# Patient Record
Sex: Female | Born: 1975 | Race: Black or African American | Hispanic: No | Marital: Single | Smoking: Current every day smoker
Health system: Southern US, Community
[De-identification: ages and names within clinical notes are randomized; demographics above are authoritative.]

## PROBLEM LIST (undated history)

## (undated) DIAGNOSIS — D649 Anemia, unspecified: Secondary | ICD-10-CM

## (undated) HISTORY — DX: Anemia, unspecified: D64.9

---

## 2018-12-23 ENCOUNTER — Emergency Department (HOSPITAL_COMMUNITY)
Admission: EM | Admit: 2018-12-23 | Discharge: 2018-12-23 | Disposition: A | Discharge status: Home / Self Care | Payer: Self-pay | Attending: Emergency Medicine | Admitting: Emergency Medicine

## 2018-12-23 ENCOUNTER — Other Ambulatory Visit: Payer: Self-pay

## 2018-12-23 ENCOUNTER — Emergency Department (HOSPITAL_COMMUNITY): Payer: Self-pay

## 2018-12-23 ENCOUNTER — Encounter (HOSPITAL_COMMUNITY): Payer: Self-pay | Admitting: Emergency Medicine

## 2018-12-23 DIAGNOSIS — F17200 Nicotine dependence, unspecified, uncomplicated: Secondary | ICD-10-CM | POA: Insufficient documentation

## 2018-12-23 DIAGNOSIS — J111 Influenza due to unidentified influenza virus with other respiratory manifestations: Secondary | ICD-10-CM | POA: Insufficient documentation

## 2018-12-23 DIAGNOSIS — R69 Illness, unspecified: Secondary | ICD-10-CM

## 2018-12-23 MED ORDER — OSELTAMIVIR PHOSPHATE 75 MG PO CAPS: 75.0000 mg | ORAL_CAPSULE | Freq: Two times a day (BID) | ORAL | 0 refills | Status: DC

## 2018-12-23 MED ORDER — IBUPROFEN 400 MG PO TABS
400.0000 mg | ORAL_TABLET | Freq: Once | ORAL | Status: AC
  Administered 2018-12-23: 400 mg via ORAL
  Filled 2018-12-23: qty 1

## 2018-12-23 MED ORDER — ACETAMINOPHEN 325 MG PO TABS
650.0000 mg | ORAL_TABLET | Freq: Once | ORAL | Status: AC | PRN
  Administered 2018-12-23: 650 mg via ORAL
  Filled 2018-12-23: qty 2

## 2018-12-23 NOTE — ED Triage Notes (Signed)
C/o fever (Tmax 104.3), generalized body aches, chills, and non-productive cough since Sunday morning.

## 2018-12-23 NOTE — ED Notes (Signed)
ED Provider at bedside for re-evaluation 

## 2018-12-23 NOTE — ED Notes (Signed)
Patient verbalizes understanding of discharge instructions. Opportunity for questioning and answers were provided. Armband removed by staff, pt discharged from ED ambulatory.   

## 2018-12-23 NOTE — ED Provider Notes (Signed)
[facility] Provider Note   CSN: [id number] Arrival date & time: 12/23/18  0531     History   Chief Complaint Chief Complaint  Patient presents with  . flu like symptoms    HPI Leanora Jerrye NobleDenise Dominy is a 43 y.o. female.  Patient is a 43 year old female who presents with a 2-day history of flulike symptoms.  She has had runny nose congestion coughing and myalgias.  She has no vomiting.  No diarrhea.  She does report some intermittent wheezing with shortness of breath.  She denies any current shortness of breath.  She has been using TheraFlu with no improvement in symptoms.     History reviewed. No pertinent past medical history.  There are no active problems to display for this patient.   History reviewed. No pertinent surgical history.   OB History   No obstetric history on file.      Home Medications    Prior to Admission medications   Medication Sig Start Date End Date Taking? Authorizing Provider  oseltamivir (TAMIFLU) 75 MG capsule Take 1 capsule (75 mg total) by mouth every 12 (twelve) hours. 12/23/18   Rolan BuccoBelfi, Evelynn Hench, MD    Family History No family history on file.  Social History Social History   Tobacco Use  . Smoking status: Current Every Day Smoker  . Smokeless tobacco: Never Used  Substance Use Topics  . Alcohol use: Not Currently  . Drug use: Never     Allergies   Patient has no known allergies.   Review of Systems Review of Systems  Constitutional: Positive for fatigue and fever. Negative for chills and diaphoresis.  HENT: Positive for congestion and rhinorrhea. Negative for sneezing.   Eyes: Negative.   Respiratory: Positive for cough, shortness of breath and wheezing. Negative for chest tightness.   Cardiovascular: Negative for chest pain and leg swelling.  Gastrointestinal: Negative for abdominal pain, blood in stool, diarrhea, nausea and vomiting.  Genitourinary: Negative for difficulty  urinating, flank pain, frequency and hematuria.  Musculoskeletal: Positive for myalgias. Negative for arthralgias and back pain.  Skin: Negative for rash.  Neurological: Positive for headaches. Negative for dizziness, speech difficulty, weakness and numbness.     Physical Exam Updated Vital Signs BP 115/65   Pulse (!) 108   Temp (!) 101.4 F (38.6 C) (Oral)   Resp 20   Ht 5\' 5"  (1.651 m)   Wt 83.5 kg   LMP 12/02/2018   SpO2 99%   BMI 30.62 kg/m   Physical Exam Constitutional:      Appearance: She is well-developed.  HENT:     Head: Normocephalic and atraumatic.     Right Ear: Tympanic membrane normal.     Left Ear: Tympanic membrane normal.     Mouth/Throat:     Mouth: Mucous membranes are moist.     Pharynx: No oropharyngeal exudate or posterior oropharyngeal erythema.  Eyes:     Pupils: Pupils are equal, round, and reactive to light.  Neck:     Musculoskeletal: Normal range of motion and neck supple.     Comments: No meningismus Cardiovascular:     Rate and Rhythm: Normal rate and regular rhythm.     Heart sounds: Normal heart sounds.  Pulmonary:     Effort: Pulmonary effort is normal. No respiratory distress.     Breath sounds: Normal breath sounds. No wheezing or rales.  Chest:     Chest wall: No tenderness.  Abdominal:  General: Bowel sounds are normal.     Palpations: Abdomen is soft.     Tenderness: There is no abdominal tenderness. There is no guarding or rebound.  Musculoskeletal: Normal range of motion.  Lymphadenopathy:     Cervical: No cervical adenopathy.  Skin:    General: Skin is warm and dry.     Findings: No rash.  Neurological:     Mental Status: She is alert and oriented to person, place, and time.      ED Treatments / Results  Labs (all labs ordered are listed, but only abnormal results are displayed) Labs Reviewed - No data to display  EKG None  Radiology Dg Chest 2 View  Result Date: 12/23/2018 CLINICAL DATA:  Fever.   Generalized aches and chills. EXAM: CHEST - 2 VIEW COMPARISON:  None. FINDINGS: Probable minimal atelectasis in the left base. The heart, hila, mediastinum, lungs, and pleura are otherwise normal. IMPRESSION: No active cardiopulmonary disease. Electronically Signed   By: Gerome Sam III M.D   On: 12/23/2018 09:55    Procedures Procedures (including critical care time)  Medications Ordered in ED Medications  acetaminophen (TYLENOL) tablet 650 mg (650 mg Oral Given 12/23/18 0815)  ibuprofen (ADVIL,MOTRIN) tablet 400 mg (400 mg Oral Given 12/23/18 1005)     Initial Impression / Assessment and Plan / ED Course  I have reviewed the triage vital signs and the nursing notes.  Pertinent labs & imaging results that were available during my care of the patient were reviewed by me and considered in my medical decision making (see chart for details).     Patient presents with flulike symptoms.  She is feel a bit better after Tylenol.  She has no hypoxia or shortness of breath currently.  Her chest x-ray is negative for pneumonia.  She has no vomiting.  No other symptoms that would be more concerning for meningitis.  She is otherwise well-appearing.  She was discharged home in good condition.  She did opt for Tamiflu treatment.  She was given a prescription for Tamiflu.  Return precautions were given.  Final Clinical Impressions(s) / ED Diagnoses   Final diagnoses:  Influenza-like illness    ED Discharge Orders         Ordered    oseltamivir (TAMIFLU) 75 MG capsule  Every 12 hours     12/23/18 1005           Rolan Bucco, MD 12/23/18 1007

## 2018-12-23 NOTE — ED Notes (Signed)
Patient transported to X-ray 

## 2019-01-30 ENCOUNTER — Ambulatory Visit (HOSPITAL_COMMUNITY)
Admission: EM | Admit: 2019-01-30 | Discharge: 2019-01-30 | Disposition: A | Discharge status: Home / Self Care | Payer: Self-pay | Attending: Family Medicine | Admitting: Family Medicine

## 2019-01-30 ENCOUNTER — Encounter (HOSPITAL_COMMUNITY): Payer: Self-pay | Admitting: Emergency Medicine

## 2019-01-30 ENCOUNTER — Other Ambulatory Visit: Payer: Self-pay

## 2019-01-30 DIAGNOSIS — T148XXA Other injury of unspecified body region, initial encounter: Secondary | ICD-10-CM

## 2019-01-30 DIAGNOSIS — M62830 Muscle spasm of back: Secondary | ICD-10-CM

## 2019-01-30 MED ORDER — CYCLOBENZAPRINE HCL 10 MG PO TABS: 10.0000 mg | ORAL_TABLET | Freq: Two times a day (BID) | ORAL | 0 refills | Status: DC | PRN

## 2019-01-30 MED ORDER — PREDNISONE 10 MG (21) PO TBPK: ORAL_TABLET | ORAL | 0 refills | Status: DC

## 2019-01-30 NOTE — ED Triage Notes (Signed)
Pt complains of mid upper back pain that radiates up her spine into her neck, and right shoulder.

## 2019-01-30 NOTE — ED Provider Notes (Signed)
[facility]    CSN: [id number] Arrival date & time: 01/30/19  9528     History   Chief Complaint Chief Complaint  Patient presents with  . Back Pain    HPI Breanna Carlson is a 43 y.o. female.   Patient is a 43 year old female presents with right upper back pain, neck pain, shoulder pain that has been constant and worsening for the past week.  Describes the pain as sharp and stabbing at times.  Denies any injuries to the shoulder or back.  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  She has been using icy hot on the area without much relief.  She is also been doing massages without much relief.  Denies any fever, chills, swelling, erythema.  Denies any numbness, tingling or weakness of the arm.  ROS per HPI    Back Pain    History reviewed. No pertinent past medical history.  There are no active problems to display for this patient.   History reviewed. No pertinent surgical history.  OB History   No obstetric history on file.      Home Medications    Prior to Admission medications   Medication Sig Start Date End Date Taking? Authorizing Provider  cyclobenzaprine (FLEXERIL) 10 MG tablet Take 1 tablet (10 mg total) by mouth 2 (two) times daily as needed for muscle spasms. 01/30/19   Braedyn Kauk, Gloris Manchester A, NP  predniSONE (STERAPRED UNI-PAK 21 TAB) 10 MG (21) TBPK tablet 6 tabs for 1 day, then 5 tabs for 1 das, then 4 tabs for 1 day, then 3 tabs for 1 day, 2 tabs for 1 day, then 1 tab for 1 day 01/30/19   Janace Aris, NP    Family History History reviewed. No pertinent family history.  Social History Social History   Tobacco Use  . Smoking status: Current Every Day Smoker    Packs/day: 0.50    Types: Cigarettes  . Smokeless tobacco: Never Used  Substance Use Topics  . Alcohol use: Not Currently  . Drug use: Never     Allergies   Patient has no known allergies.   Review of Systems Review of Systems  Musculoskeletal:  Positive for back pain.     Physical Exam Triage Vital Signs ED Triage Vitals  Enc Vitals Group     BP 01/30/19 0847 (!) 144/78     Pulse Rate 01/30/19 0847 75     Resp 01/30/19 0847 18     Temp 01/30/19 0847 98.7 F (37.1 C)     Temp Source 01/30/19 0847 Oral     SpO2 01/30/19 0847 100 %     Weight --      Height --      Head Circumference --      Peak Flow --      Pain Score 01/30/19 0848 9     Pain Loc --      Pain Edu? --      Excl. in GC? --    No data found.  Updated Vital Signs BP (!) 144/78 (BP Location: Left Arm)   Pulse 75   Temp 98.7 F (37.1 C) (Oral)   Resp 18   LMP 01/24/2019 (Approximate)   SpO2 100%   Visual Acuity Right Eye Distance:   Left Eye Distance:   Bilateral Distance:    Right Eye Near:   Left Eye Near:    Bilateral Near:     Physical Exam  Vitals signs and nursing note reviewed.  Constitutional:      Appearance: Normal appearance.     Comments: Appears in pain   HENT:     Head: Normocephalic and atraumatic.     Nose: Nose normal.     Mouth/Throat:     Pharynx: Oropharynx is clear.  Eyes:     Conjunctiva/sclera: Conjunctivae normal.  Neck:     Musculoskeletal: Normal range of motion. No muscular tenderness.  Pulmonary:     Effort: Pulmonary effort is normal.  Musculoskeletal:       Arms:     Comments: Tender to palpation of the thoracic paravertebral musculature into the trapezius muscle.  Muscle spasm present. Patient has full range of motion of the shoulder, neck and back. No bony tenderness.  Skin:    General: Skin is warm and dry.  Neurological:     Mental Status: She is alert.  Psychiatric:        Mood and Affect: Mood normal.      UC Treatments / Results  Labs (all labs ordered are listed, but only abnormal results are displayed) Labs Reviewed - No data to display  EKG None  Radiology No results found.  Procedures Procedures (including critical care time)  Medications Ordered in UC Medications -  No data to display  Initial Impression / Assessment and Plan / UC Course  I have reviewed the triage vital signs and the nursing notes.  Pertinent labs & imaging results that were available during my care of the patient were reviewed by me and considered in my medical decision making (see chart for details).    Symptoms consistent with Muscle spasm with nerve compression We will treat with prednisone daily for the next 6 days and muscle relaxer to use as needed Instructed on exercises with tennis ball and deep massage Follow up as needed for continued or worsening symptoms Work note given   Final Clinical Impressions(s) / UC Diagnoses   Final diagnoses:  Muscle spasm of back  Nerve compression     Discharge Instructions     I believe that you have a muscle spasm causing some nerve compression.  We will treat this with prednisone daily for the next 6 days.  Make sure you take this with food. Muscle relaxer to use as needed.  Be aware this will make you drowsy Try the exercises that we talked about and massaging Follow up as needed for continued or worsening symptoms     ED Prescriptions    Medication Sig Dispense Auth. Provider   predniSONE (STERAPRED UNI-PAK 21 TAB) 10 MG (21) TBPK tablet 6 tabs for 1 day, then 5 tabs for 1 das, then 4 tabs for 1 day, then 3 tabs for 1 day, 2 tabs for 1 day, then 1 tab for 1 day 21 tablet Jaycob Mcclenton A, NP   cyclobenzaprine (FLEXERIL) 10 MG tablet Take 1 tablet (10 mg total) by mouth 2 (two) times daily as needed for muscle spasms. 20 tablet Dahlia Byes A, NP     Controlled Substance Prescriptions [place] Controlled Substance Registry consulted? Not Applicable   Janace Aris, NP 01/30/19 [phone]

## 2019-01-30 NOTE — Discharge Instructions (Signed)
I believe that you have a muscle spasm causing some nerve compression.  We will treat this with prednisone daily for the next 6 days.  Make sure you take this with food. Muscle relaxer to use as needed.  Be aware this will make you drowsy Try the exercises that we talked about and massaging Follow up as needed for continued or worsening symptoms

## 2019-02-10 ENCOUNTER — Emergency Department (HOSPITAL_COMMUNITY)
Admission: EM | Admit: 2019-02-10 | Discharge: 2019-02-10 | Disposition: A | Discharge status: Home / Self Care | Payer: Self-pay | Attending: Emergency Medicine | Admitting: Emergency Medicine

## 2019-02-10 ENCOUNTER — Other Ambulatory Visit: Payer: Self-pay

## 2019-02-10 DIAGNOSIS — X509XXA Other and unspecified overexertion or strenuous movements or postures, initial encounter: Secondary | ICD-10-CM | POA: Insufficient documentation

## 2019-02-10 DIAGNOSIS — F17210 Nicotine dependence, cigarettes, uncomplicated: Secondary | ICD-10-CM | POA: Insufficient documentation

## 2019-02-10 DIAGNOSIS — Y939 Activity, unspecified: Secondary | ICD-10-CM | POA: Insufficient documentation

## 2019-02-10 DIAGNOSIS — Y999 Unspecified external cause status: Secondary | ICD-10-CM | POA: Insufficient documentation

## 2019-02-10 DIAGNOSIS — Y929 Unspecified place or not applicable: Secondary | ICD-10-CM | POA: Insufficient documentation

## 2019-02-10 DIAGNOSIS — S46811A Strain of other muscles, fascia and tendons at shoulder and upper arm level, right arm, initial encounter: Secondary | ICD-10-CM | POA: Insufficient documentation

## 2019-02-10 MED ORDER — ACETAMINOPHEN 500 MG PO TABS
1000.0000 mg | ORAL_TABLET | Freq: Once | ORAL | Status: AC
  Administered 2019-02-10: 1000 mg via ORAL
  Filled 2019-02-10: qty 2

## 2019-02-10 MED ORDER — DIAZEPAM 5 MG PO TABS: 5.0000 mg | ORAL_TABLET | Freq: Every evening | ORAL | 0 refills | Status: DC | PRN

## 2019-02-10 MED ORDER — DIAZEPAM 5 MG PO TABS
5.0000 mg | ORAL_TABLET | Freq: Once | ORAL | Status: AC
  Administered 2019-02-10: 5 mg via ORAL
  Filled 2019-02-10: qty 1

## 2019-02-10 MED ORDER — KETOROLAC TROMETHAMINE 60 MG/2ML IM SOLN
15.0000 mg | Freq: Once | INTRAMUSCULAR | Status: AC
  Administered 2019-02-10: 15 mg via INTRAMUSCULAR
  Filled 2019-02-10: qty 2

## 2019-02-10 MED ORDER — OXYCODONE HCL 5 MG PO TABS
5.0000 mg | ORAL_TABLET | Freq: Once | ORAL | Status: AC
  Administered 2019-02-10: 5 mg via ORAL
  Filled 2019-02-10: qty 1

## 2019-02-10 NOTE — ED Triage Notes (Signed)
Pt having neck back and shoulder pain

## 2019-02-10 NOTE — ED Notes (Signed)
Patient verbalizes understanding of discharge instructions . Opportunity for questions and answers were provided . Armband removed by staff ,Pt discharged from ED. W/C  offered at D/C  and Declined W/C at D/C and was escorted to lobby by RN.  

## 2019-02-10 NOTE — ED Provider Notes (Signed)
[facility] EMERGENCY DEPARTMENT Provider Note   CSN: [id number] Arrival date & time: 02/10/19  1600    History   Chief Complaint Chief Complaint  Patient presents with  . Back Pain  . Shoulder Pain    HPI Akelia Anihya Tuma is a 43 y.o. female.     43 yo F with a chief complaint of right shoulder pain.  This been going on for about 2 weeks now.  Initially started and she continue to work and had ongoing symptoms now she feels that it is gotten into a new area at the base of her skull on the right and up into her neck and down a little bit further in her back.  She also has radiation down to her hand off and on.  She later states that the pain is actually little bit better than it used to be but is concerned because she has ongoing pain.  She had talked with some friends and was concerned for herniated disc.  She denies numbness or weakness of the arm.  The history is provided by the patient.  Back Pain  Associated symptoms: no chest pain, no dysuria, no fever and no headaches   Shoulder Pain  Location:  Shoulder Shoulder location:  R shoulder Injury: no   Pain details:    Quality:  Sharp, shooting, dull and burning   Radiates to:  R arm   Severity:  Severe   Onset quality:  Gradual   Duration:  2 weeks   Timing:  Constant   Progression:  Worsening Handedness:  Right-handed Dislocation: no   Prior injury to area:  No Relieved by:  Nothing Worsened by:  Nothing Ineffective treatments:  None tried Associated symptoms: neck pain   Associated symptoms: no back pain and no fever     No past medical history on file.  There are no active problems to display for this patient.   No past surgical history on file.   OB History   No obstetric history on file.      Home Medications    Prior to Admission medications   Medication Sig Start Date End Date Taking? Authorizing Provider  diazepam (VALIUM) 5 MG tablet Take 1 tablet (5 mg total) by  mouth at bedtime as needed for muscle spasms. 02/10/19   Melene Plan, DO  predniSONE (STERAPRED UNI-PAK 21 TAB) 10 MG (21) TBPK tablet 6 tabs for 1 day, then 5 tabs for 1 das, then 4 tabs for 1 day, then 3 tabs for 1 day, 2 tabs for 1 day, then 1 tab for 1 day 01/30/19   Janace Aris, NP    Family History No family history on file.  Social History Social History   Tobacco Use  . Smoking status: Current Every Day Smoker    Packs/day: 0.50    Types: Cigarettes  . Smokeless tobacco: Never Used  Substance Use Topics  . Alcohol use: Not Currently  . Drug use: Never     Allergies   Patient has no known allergies.   Review of Systems Review of Systems  Constitutional: Negative for chills and fever.  HENT: Negative for congestion and rhinorrhea.   Eyes: Negative for redness and visual disturbance.  Respiratory: Negative for shortness of breath and wheezing.   Cardiovascular: Negative for chest pain and palpitations.  Gastrointestinal: Negative for nausea and vomiting.  Genitourinary: Negative for dysuria and urgency.  Musculoskeletal: Positive for arthralgias and neck pain. Negative for back pain  and myalgias.  Skin: Negative for pallor and wound.  Neurological: Negative for dizziness and headaches.     Physical Exam Updated Vital Signs BP 111/83 (BP Location: Right Arm)   Pulse 100   Temp 98.5 F (36.9 C) (Oral)   Resp 16   Ht 5\' 5"  (1.651 m)   Wt 81 kg   LMP 01/24/2019 (Approximate)   SpO2 100%   BMI 29.72 kg/m   Physical Exam Vitals signs and nursing note reviewed.  Constitutional:      General: She is not in acute distress.    Appearance: She is well-developed. She is not diaphoretic.  HENT:     Head: Normocephalic and atraumatic.  Eyes:     Pupils: Pupils are equal, round, and reactive to light.  Neck:     Musculoskeletal: Normal range of motion and neck supple.  Cardiovascular:     Rate and Rhythm: Normal rate and regular rhythm.     Heart sounds: No  murmur. No friction rub. No gallop.   Pulmonary:     Effort: Pulmonary effort is normal.     Breath sounds: No wheezing or rales.  Abdominal:     General: There is no distension.     Palpations: Abdomen is soft.     Tenderness: There is no abdominal tenderness.  Musculoskeletal:        General: Tenderness present.     Comments: Tenderness diffusely about the trapezius with some muscle spasm.  This extends up to the attachment of the occiput and down to the acromion.  She also has some pain between the scapula and the spine.  There is all on the right side.  Pulse motor and sensation is intact to the right upper extremity. She has no pain with range of motion of the neck, difficult to perform Spurling's due to her pain.  Skin:    General: Skin is warm and dry.  Neurological:     Mental Status: She is alert and oriented to person, place, and time.  Psychiatric:        Behavior: Behavior normal.      ED Treatments / Results  Labs (all labs ordered are listed, but only abnormal results are displayed) Labs Reviewed - No data to display  EKG None  Radiology No results found.  Procedures Procedures (including critical care time)  Medications Ordered in ED Medications  acetaminophen (TYLENOL) tablet 1,000 mg (1,000 mg Oral Given 02/10/19 1648)  ketorolac (TORADOL) injection 15 mg (15 mg Intramuscular Given 02/10/19 1648)  oxyCODONE (Oxy IR/ROXICODONE) immediate release tablet 5 mg (5 mg Oral Given 02/10/19 1648)  diazepam (VALIUM) tablet 5 mg (5 mg Oral Given 02/10/19 1648)     Initial Impression / Assessment and Plan / ED Course  I have reviewed the triage vital signs and the nursing notes.  Pertinent labs & imaging results that were available during my care of the patient were reviewed by me and considered in my medical decision making (see chart for details).        43 yo F with a chief complaint of right-sided neck pain.  Going on for about a week and a half now.   Reproduced with palpation of the trapezius muscle belly.  I suspect that this is muscular strain and worsened by her not resting.  She also has not had any NSAIDs for this.  We will start her on NSAIDs, Valium for muscle relaxant.  Placed in a sling. PCP follow-up.  5:06 PM:  I have discussed the diagnosis/risks/treatment options with the patient and believe the pt to be eligible for discharge home to follow-up with PCP. We also discussed returning to the ED immediately if new or worsening sx occur. We discussed the sx which are most concerning (e.g., sudden worsening pain, fever, numbness or weakness to the arm) that necessitate immediate return. Medications administered to the patient during their visit and any new prescriptions provided to the patient are listed below.  Medications given during this visit Medications  acetaminophen (TYLENOL) tablet 1,000 mg (1,000 mg Oral Given 02/10/19 1648)  ketorolac (TORADOL) injection 15 mg (15 mg Intramuscular Given 02/10/19 1648)  oxyCODONE (Oxy IR/ROXICODONE) immediate release tablet 5 mg (5 mg Oral Given 02/10/19 1648)  diazepam (VALIUM) tablet 5 mg (5 mg Oral Given 02/10/19 1648)     The patient appears reasonably screen and/or stabilized for discharge and I doubt any other medical condition or other [facility] requiring further screening, evaluation, or treatment in the ED at this time prior to discharge.    Final Clinical Impressions(s) / ED Diagnoses   Final diagnoses:  Trapezius strain, right, initial encounter    ED Discharge Orders         Ordered    diazepam (VALIUM) 5 MG tablet  At bedtime PRN     02/10/19 1636           Melene Plan, DO 02/10/19 1707

## 2019-02-10 NOTE — Discharge Instructions (Addendum)
Take 4 over the counter ibuprofen tablets 3 times a day or 2 over-the-counter naproxen tablets twice a day for pain. Also take tylenol 1000mg (2 extra strength) four times a day.   Take your arm out of the sling at least 4 times a day and perform range of motion exercises.  Please return to the emergency department for numbness or weakness to the arm.  Please do not take the Flexeril with Valium.  This can make you excessively sleepy and could make you have an accident or injury.

## 2019-12-01 ENCOUNTER — Other Ambulatory Visit: Payer: Self-pay

## 2019-12-01 ENCOUNTER — Ambulatory Visit (HOSPITAL_COMMUNITY)
Admission: EM | Admit: 2019-12-01 | Discharge: 2019-12-01 | Disposition: A | Discharge status: Home / Self Care | Payer: HRSA Program | Attending: Family Medicine | Admitting: Family Medicine

## 2019-12-01 ENCOUNTER — Encounter (HOSPITAL_COMMUNITY): Payer: Self-pay | Admitting: Emergency Medicine

## 2019-12-01 DIAGNOSIS — J069 Acute upper respiratory infection, unspecified: Secondary | ICD-10-CM | POA: Diagnosis present

## 2019-12-01 DIAGNOSIS — Z20822 Contact with and (suspected) exposure to covid-19: Secondary | ICD-10-CM | POA: Insufficient documentation

## 2019-12-01 NOTE — Discharge Instructions (Signed)
CALL FOR PROBLEMS Go home to rest Drink plenty of fluids Take Tylenol for pain or fever You may take over-the-counter cough and cold medicines as needed You must quarantine at home until your test result is available You can check for your test result in MyChart

## 2019-12-01 NOTE — ED Triage Notes (Addendum)
Pt states that her family that she does not live with but was last exposed to last Thursday have all tested positive for Covid. (upon further questioning her family tested positive weeks ago and she only saw them at the door on Thursday.)  Pt reports 2 week history of mild chest pain with SOB, a 2 day history of headache and and 3 days of diarrhea.   Pt denies fever, but states she gets chills.  Pt states her food does not taste right.

## 2019-12-01 NOTE — ED Provider Notes (Signed)
[place]    CSN: [id number] Arrival date & time: 12/01/19  1255      History   Chief Complaint Chief Complaint  Patient presents with  . Covid Exposure    HPI Breanna Carlson is a 44 y.o. female.   HPI  Family has COVID Briefly exposed Diarrhea and chest tightness for several days.  Shortness of breath with exertion.  No coughing.  No chest pain. She does feel some fatigue and body aches. No loss of appetite, sense of taste or smell She requests Covid testing She is been trying to drink enough fluids.  Questions whether she can take something for diarrhea.  Questions management of her viral symptoms  History reviewed. No pertinent past medical history.  There are no problems to display for this patient.   History reviewed. No pertinent surgical history.  OB History   No obstetric history on file.      Home Medications    Prior to Admission medications   Medication Sig Start Date End Date Taking? Authorizing Provider  diazepam (VALIUM) 5 MG tablet Take 1 tablet (5 mg total) by mouth at bedtime as needed for muscle spasms. 02/10/19   Deno Etienne, DO    Family History Family History  Problem Relation Age of Onset  . Diabetes Mother   . Hypertension Mother   . Seizures Mother     Social History Social History   Tobacco Use  . Smoking status: Current Every Day Smoker    Packs/day: 0.50    Types: Cigarettes  . Smokeless tobacco: Never Used  Substance Use Topics  . Alcohol use: Not Currently  . Drug use: Never     Allergies   Patient has no known allergies.   Review of Systems Review of Systems  Constitutional: Positive for fatigue. Negative for chills and fever.  HENT: Negative for congestion and hearing loss.   Eyes: Negative for pain.  Respiratory: Positive for chest tightness and shortness of breath. Negative for cough.   Cardiovascular: Negative for chest pain and leg swelling.  Gastrointestinal: Positive for diarrhea.  Negative for nausea.  Genitourinary: Negative for dysuria and frequency.  Musculoskeletal: Negative for myalgias.  Neurological: Negative for dizziness and headaches.  Psychiatric/Behavioral: The patient is not nervous/anxious.      Physical Exam Triage Vital Signs ED Triage Vitals [12/01/19 1429]  Enc Vitals Group     BP (!) 141/103     Pulse Rate 76     Resp 14     Temp 99 F (37.2 C)     Temp Source Oral     SpO2 100 %     Weight      Height      Head Circumference      Peak Flow      Pain Score      Pain Loc      Pain Edu?      Excl. in [place]?    No data found.  Updated Vital Signs BP (!) 141/103 (BP Location: Left Arm)   Pulse 76   Temp 99 F (37.2 C) (Oral)   Resp 14   LMP 11/27/2019 (Exact Date)   SpO2 100%   Physical Exam Constitutional:      General: She is not in acute distress.    Appearance: She is well-developed. She is ill-appearing.     Comments: Appears tired  HENT:     Head: Normocephalic and atraumatic.     Mouth/Throat:  Comments: Mask is in place Eyes:     Conjunctiva/sclera: Conjunctivae normal.     Pupils: Pupils are equal, round, and reactive to light.  Cardiovascular:     Rate and Rhythm: Normal rate.     Heart sounds: Normal heart sounds.  Pulmonary:     Effort: Pulmonary effort is normal. No respiratory distress.     Breath sounds: No wheezing or rales.  Abdominal:     General: There is no distension.     Palpations: Abdomen is soft.     Tenderness: There is no abdominal tenderness.  Musculoskeletal:        General: Normal range of motion.     Cervical back: Normal range of motion.  Skin:    General: Skin is warm and dry.  Neurological:     Mental Status: She is alert.  Psychiatric:        Mood and Affect: Mood normal.        Behavior: Behavior normal.      UC Treatments / Results  Labs (all labs ordered are listed, but only abnormal results are displayed) Labs Reviewed  NOVEL CORONAVIRUS, NAA (HOSP ORDER,  SEND-OUT TO REF LAB; TAT 18-24 HRS)    EKG   Radiology No results found.  Procedures Procedures (including critical care time)  Medications Ordered in UC Medications - No data to display  Initial Impression / Assessment and Plan / UC Course  I have reviewed the triage vital signs and the nursing notes.  Pertinent labs & imaging results that were available during my care of the patient were reviewed by me and considered in my medical decision making (see chart for details).     We reviewed signs and symptoms of Covid.  Treatment at home.  Reasons for return Final Clinical Impressions(s) / UC Diagnoses   Final diagnoses:  Viral URI  Exposure to COVID-19 virus     Discharge Instructions     CALL FOR PROBLEMS Go home to rest Drink plenty of fluids Take Tylenol for pain or fever You may take over-the-counter cough and cold medicines as needed You must quarantine at home until your test result is available You can check for your test result in MyChart    ED Prescriptions    None     PDMP not reviewed this encounter.   Eustace Moore, MD 12/01/19 [phone]

## 2019-12-03 LAB — NOVEL CORONAVIRUS, NAA (HOSP ORDER, SEND-OUT TO REF LAB; TAT 18-24 HRS): SARS-CoV-2, NAA: NOT DETECTED

## 2020-02-18 ENCOUNTER — Other Ambulatory Visit: Payer: Self-pay

## 2020-02-18 ENCOUNTER — Encounter (HOSPITAL_COMMUNITY): Payer: Self-pay

## 2020-02-18 ENCOUNTER — Ambulatory Visit (HOSPITAL_COMMUNITY)
Admission: EM | Admit: 2020-02-18 | Discharge: 2020-02-18 | Disposition: A | Discharge status: Home / Self Care | Payer: Self-pay | Attending: Family Medicine | Admitting: Family Medicine

## 2020-02-18 DIAGNOSIS — R5383 Other fatigue: Secondary | ICD-10-CM

## 2020-02-18 DIAGNOSIS — J3489 Other specified disorders of nose and nasal sinuses: Secondary | ICD-10-CM

## 2020-02-18 DIAGNOSIS — G43909 Migraine, unspecified, not intractable, without status migrainosus: Secondary | ICD-10-CM

## 2020-02-18 MED ORDER — METOCLOPRAMIDE HCL 5 MG/ML IJ SOLN
5.0000 mg | Freq: Once | INTRAMUSCULAR | Status: AC
  Administered 2020-02-18: 15:00:00 5 mg via INTRAMUSCULAR

## 2020-02-18 MED ORDER — NAPROXEN 500 MG PO TABS: 500.0000 mg | ORAL_TABLET | Freq: Two times a day (BID) | ORAL | 0 refills | Status: DC | PRN

## 2020-02-18 MED ORDER — METOCLOPRAMIDE HCL 5 MG/ML IJ SOLN
INTRAMUSCULAR | Status: AC
  Filled 2020-02-18: qty 2

## 2020-02-18 MED ORDER — DEXAMETHASONE SODIUM PHOSPHATE 10 MG/ML IJ SOLN
INTRAMUSCULAR | Status: AC
  Filled 2020-02-18: qty 1

## 2020-02-18 MED ORDER — KETOROLAC TROMETHAMINE 30 MG/ML IJ SOLN
INTRAMUSCULAR | Status: AC
  Filled 2020-02-18: qty 1

## 2020-02-18 MED ORDER — AMOXICILLIN-POT CLAVULANATE 875-125 MG PO TABS: 1.0000 | ORAL_TABLET | Freq: Two times a day (BID) | ORAL | 0 refills | Status: AC

## 2020-02-18 MED ORDER — DEXAMETHASONE SODIUM PHOSPHATE 10 MG/ML IJ SOLN
10.0000 mg | Freq: Once | INTRAMUSCULAR | Status: AC
  Administered 2020-02-18: 15:00:00 10 mg via INTRAMUSCULAR

## 2020-02-18 MED ORDER — KETOROLAC TROMETHAMINE 30 MG/ML IJ SOLN
30.0000 mg | Freq: Once | INTRAMUSCULAR | Status: AC
  Administered 2020-02-18: 15:00:00 30 mg via INTRAMUSCULAR

## 2020-02-18 NOTE — Discharge Instructions (Signed)
Covid swab pending, monitor my chart for results We gave you an injection of Toradol, Decadron and Reglan today for your headache.  This should begin working in 30 to 40 minutes Continue with Naprosyn twice daily as needed for further headaches Augmentin twice daily for the next week to treat any dental infection/sinusitis Rest and push fluids  Please continue to monitor symptoms, follow-up if not improving or worsening

## 2020-02-18 NOTE — ED Notes (Signed)
Pt refused COVID test 

## 2020-02-18 NOTE — ED Triage Notes (Signed)
Pt c/o HA, loss of appetite, dehydration, weakness, chills since yesterday. Pt has sunglasses on, because she has light sensitivity due to the HA.

## 2020-02-19 NOTE — ED Provider Notes (Signed)
[place]    CSN: [id number] Arrival date & time: 02/18/20  1259      History   Chief Complaint Chief Complaint  Patient presents with  . HA, loss of appetite, dehydration, weakness    HPI Breanna Carlson is a 44 y.o. female a significant past medical history presenting today for evaluation of headache, fatigue, chills, headache and dental pain.  Patient notes that over the past week she has felt under the weather, over the past few days her symptoms have worsened.  She reports a persistent headache and bilateral temporal areas with associated photophobia and phonophobia.  Denies significant history of headaches or migraines.  She denies vision changes.  She is also had a lot of sinus pressure.  Has felt very fatigued and decreased appetite in the past 24 hours.  She also notes pain and swelling to her left lower jaw that is also worsened recently.  She denies any neck stiffness.  Denies vomiting, diarrhea.  Denies close sick contacts.  HPI  History reviewed. No pertinent past medical history.  There are no problems to display for this patient.   History reviewed. No pertinent surgical history.  OB History   No obstetric history on file.      Home Medications    Prior to Admission medications   Medication Sig Start Date End Date Taking? Authorizing Provider  amoxicillin-clavulanate (AUGMENTIN) 875-125 MG tablet Take 1 tablet by mouth every 12 (twelve) hours for 7 days. 02/18/20 02/25/20  Jin Capote C, PA-C  naproxen (NAPROSYN) 500 MG tablet Take 1 tablet (500 mg total) by mouth 2 (two) times daily as needed for headache. 02/18/20   Thyra Yinger, Elesa Hacker, PA-C    Family History Family History  Problem Relation Age of Onset  . Diabetes Mother   . Hypertension Mother   . Seizures Mother     Social History Social History   Tobacco Use  . Smoking status: Current Every Day Smoker    Packs/day: 0.50    Types: Cigarettes  . Smokeless tobacco: Never Used    Substance Use Topics  . Alcohol use: Not Currently  . Drug use: Never     Allergies   Patient has no known allergies.   Review of Systems Review of Systems  Constitutional: Positive for appetite change, chills and fatigue. Negative for activity change and fever.  HENT: Positive for congestion and sinus pressure. Negative for ear pain, rhinorrhea, sore throat and trouble swallowing.   Eyes: Positive for photophobia. Negative for discharge and redness.  Respiratory: Negative for cough, chest tightness and shortness of breath.   Cardiovascular: Negative for chest pain.  Gastrointestinal: Positive for nausea. Negative for abdominal pain, diarrhea and vomiting.  Musculoskeletal: Negative for myalgias.  Skin: Negative for rash.  Neurological: Positive for headaches. Negative for dizziness and light-headedness.     Physical Exam Triage Vital Signs ED Triage Vitals  Enc Vitals Group     BP 02/18/20 1343 127/77     Pulse Rate 02/18/20 1343 82     Resp 02/18/20 1343 16     Temp 02/18/20 1343 98.1 F (36.7 C)     Temp Source 02/18/20 1343 Oral     SpO2 02/18/20 1343 100 %     Weight 02/18/20 1342 183 lb (83 kg)     Height 02/18/20 1342 5\' 6"  (1.676 m)     Head Circumference --      Peak Flow --      Pain Score 02/18/20  1341 9     Pain Loc --      Pain Edu? --      Excl. in [place]? --    No data found.  Updated Vital Signs BP 127/77   Pulse 82   Temp 98.1 F (36.7 C) (Oral)   Resp 16   Ht 5\' 6"  (1.676 m)   Wt 183 lb (83 kg)   SpO2 100%   BMI 29.54 kg/m   Visual Acuity Right Eye Distance:   Left Eye Distance:   Bilateral Distance:    Right Eye Near:   Left Eye Near:    Bilateral Near:     Physical Exam Vitals and nursing note reviewed.  Constitutional:      Appearance: She is well-developed.     Comments: No acute distress; wearing sunglasses  HENT:     Head: Normocephalic and atraumatic.     Ears:     Comments: Bilateral ears without tenderness to  palpation of external auricle, tragus and mastoid, EAC's without erythema or swelling, TM's with good bony landmarks and cone of light. Non erythematous.     Nose: Nose normal.     Mouth/Throat:     Comments: Oral mucosa pink and moist, no tonsillar enlargement or exudate. Posterior pharynx patent and nonerythematous, no uvula deviation or swelling. Normal phonation.  Gingival tenderness and mild swelling noted to right lower posterior jaw Eyes:     Extraocular Movements: Extraocular movements intact.     Conjunctiva/sclera: Conjunctivae normal.     Pupils: Pupils are equal, round, and reactive to light.  Neck:     Comments: Full active range of motion of neck, no nuchal rigidity Cardiovascular:     Rate and Rhythm: Normal rate.  Pulmonary:     Effort: Pulmonary effort is normal. No respiratory distress.     Comments: Breathing comfortably at rest, CTABL, no wheezing, rales or other adventitious sounds auscultated Abdominal:     General: There is no distension.  Musculoskeletal:        General: Normal range of motion.     Cervical back: Neck supple.  Skin:    General: Skin is warm and dry.  Neurological:     General: No focal deficit present.     Mental Status: She is alert and oriented to person, place, and time. Mental status is at baseline.     Cranial Nerves: No cranial nerve deficit.     Motor: No weakness.     Gait: Gait normal.      UC Treatments / Results  Labs (all labs ordered are listed, but only abnormal results are displayed) Labs Reviewed  SARS CORONAVIRUS 2 (TAT 6-24 HRS)    EKG   Radiology No results found.  Procedures Procedures (including critical care time)  Medications Ordered in UC Medications  ketorolac (TORADOL) 30 MG/ML injection 30 mg (30 mg Intramuscular Given 02/18/20 1440)  metoCLOPramide (REGLAN) injection 5 mg (5 mg Intramuscular Given 02/18/20 1440)  dexamethasone (DECADRON) injection 10 mg (10 mg Intramuscular Given 02/18/20 1440)     Initial Impression / Assessment and Plan / UC Course  I have reviewed the triage vital signs and the nursing notes.  Pertinent labs & imaging results that were available during my care of the patient were reviewed by me and considered in my medical decision making (see chart for details).     Covid PCR pending.  Headache suggestive of likely migraine given associated photophobia, providing migraine cocktail to help with headache.  Discussed symptoms may also be stemming from underlying sinusitis or dental infection.  Treating with Augmentin given mild symptoms over the past week with recent worsening.  Naprosyn for further headache.  Rest and push fluids.  No neuro deficits.  Close monitoring of all symptoms,Discussed strict return precautions. Patient verbalized understanding and is agreeable with plan.  Final Clinical Impressions(s) / UC Diagnoses   Final diagnoses:  Migraine without status migrainosus, not intractable, unspecified migraine type  Sinus pressure  Fatigue, unspecified type     Discharge Instructions     Covid swab pending, monitor my chart for results We gave you an injection of Toradol, Decadron and Reglan today for your headache.  This should begin working in 30 to 40 minutes Continue with Naprosyn twice daily as needed for further headaches Augmentin twice daily for the next week to treat any dental infection/sinusitis Rest and push fluids  Please continue to monitor symptoms, follow-up if not improving or worsening   ED Prescriptions    Medication Sig Dispense Auth. Provider   amoxicillin-clavulanate (AUGMENTIN) 875-125 MG tablet Take 1 tablet by mouth every 12 (twelve) hours for 7 days. 14 tablet Josy Peaden C, PA-C   naproxen (NAPROSYN) 500 MG tablet Take 1 tablet (500 mg total) by mouth 2 (two) times daily as needed for headache. 30 tablet Suki Crockett, [place] C, PA-C     PDMP not reviewed this encounter.   Lew Dawes, [place] 02/19/20 [phone]

## 2020-02-27 ENCOUNTER — Ambulatory Visit (HOSPITAL_COMMUNITY)
Admission: EM | Admit: 2020-02-27 | Discharge: 2020-02-27 | Disposition: A | Discharge status: Home / Self Care | Payer: HRSA Program | Attending: Internal Medicine | Admitting: Internal Medicine

## 2020-02-27 ENCOUNTER — Other Ambulatory Visit: Payer: Self-pay

## 2020-02-27 DIAGNOSIS — Z20822 Contact with and (suspected) exposure to covid-19: Secondary | ICD-10-CM | POA: Insufficient documentation

## 2020-02-27 NOTE — ED Notes (Signed)
Patient returned for COVID swab, needs negative test to return to work.

## 2020-02-28 LAB — SARS CORONAVIRUS 2 (TAT 6-24 HRS): SARS Coronavirus 2: NEGATIVE

## 2020-05-19 IMAGING — DX DG CHEST 2V
2 series · 2 of 2 positions shown · non-contrast
Comparison: None.

CLINICAL DATA: Fever.  Generalized aches and chills.

EXAM:
CHEST - 2 VIEW

[w chest pa]
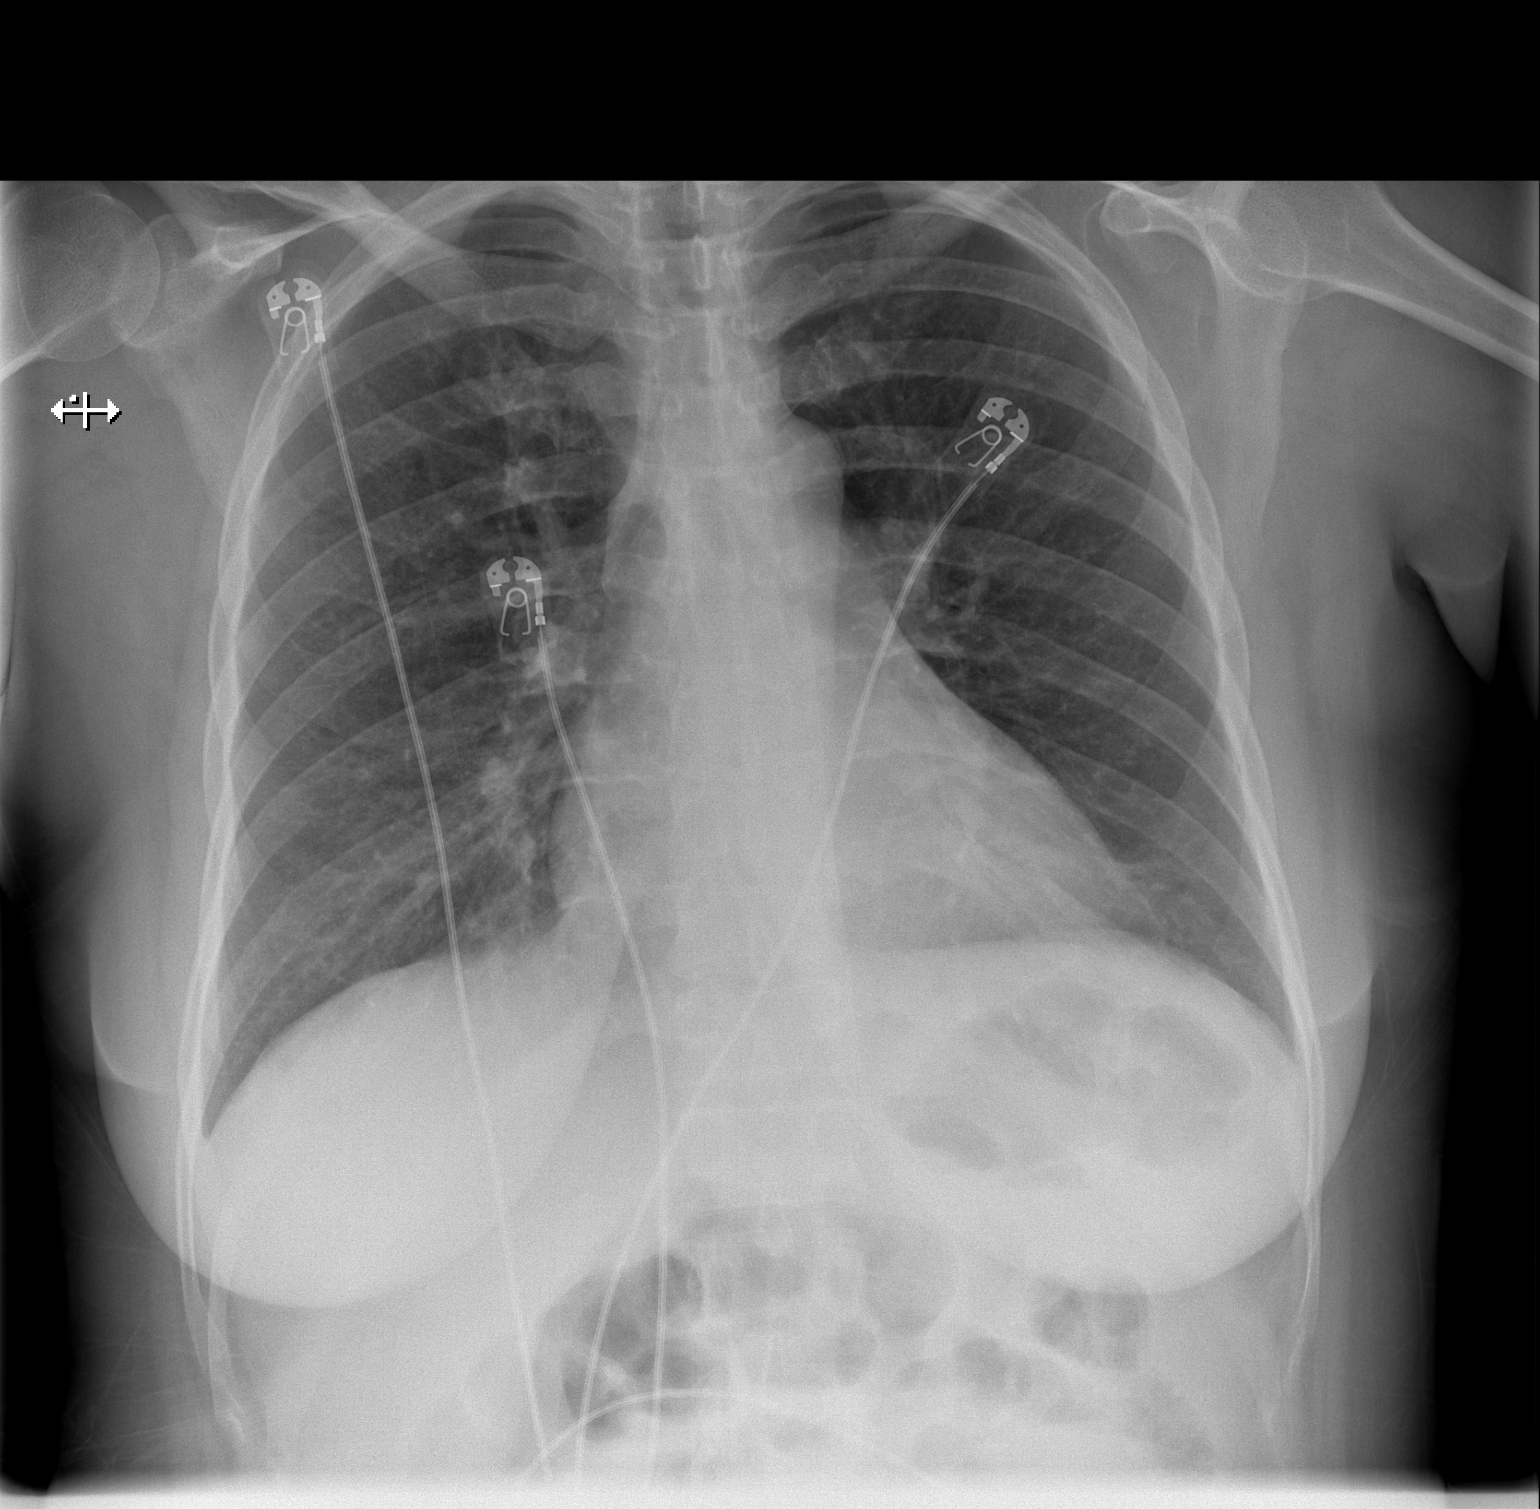

[w chest lat]
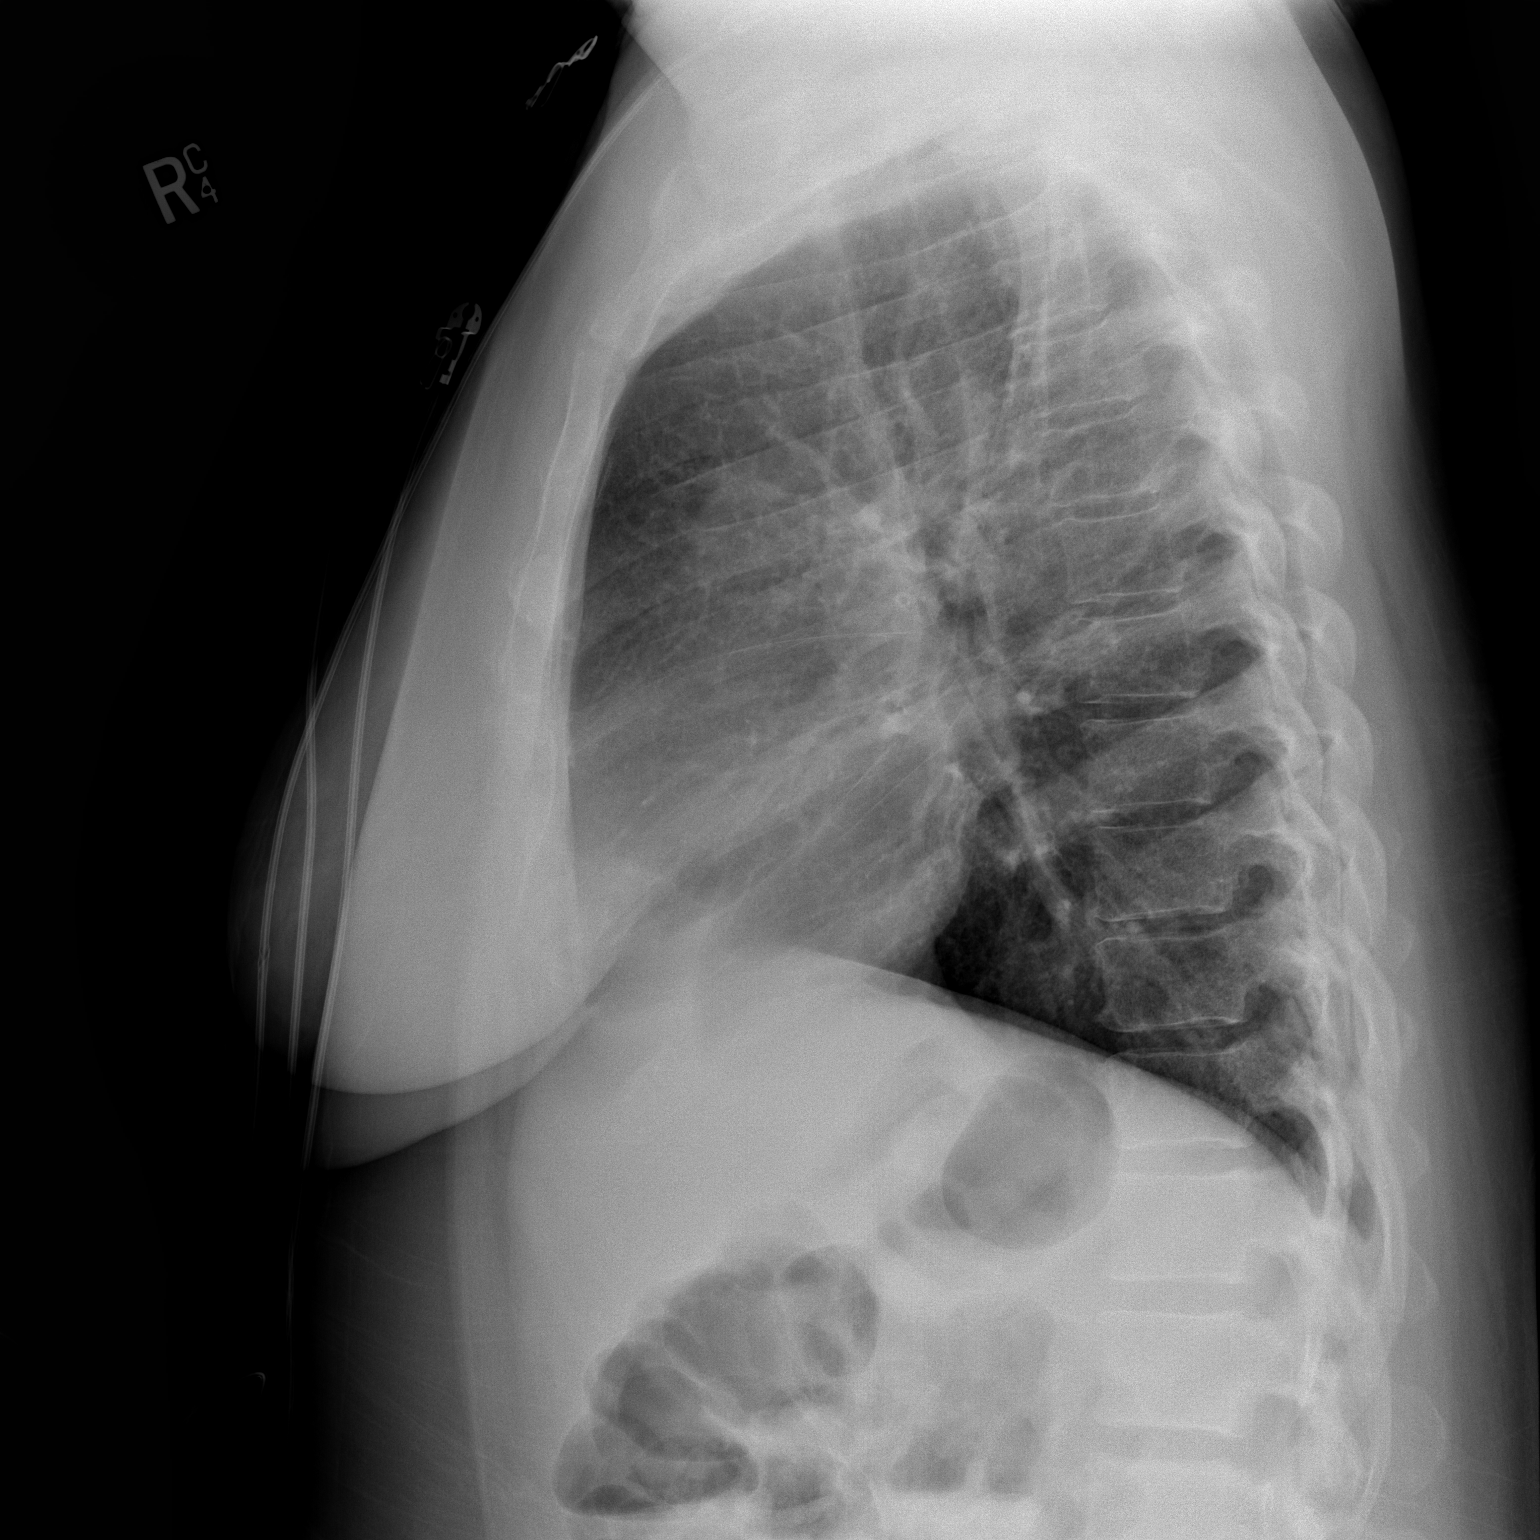

[2 of 2 positions shown; findings below may reference images not displayed]

FINDINGS: Probable minimal atelectasis in the left base. The heart, hila,
mediastinum, lungs, and pleura are otherwise normal.
IMPRESSION: No active cardiopulmonary disease.

## 2023-09-02 ENCOUNTER — Encounter (HOSPITAL_COMMUNITY): Payer: Self-pay

## 2023-09-02 ENCOUNTER — Emergency Department (HOSPITAL_COMMUNITY): Payer: Self-pay

## 2023-09-02 ENCOUNTER — Emergency Department (HOSPITAL_COMMUNITY): Payer: 59

## 2023-09-02 ENCOUNTER — Emergency Department (HOSPITAL_COMMUNITY)
Admission: EM | Admit: 2023-09-02 | Discharge: 2023-09-02 | Disposition: A | Discharge status: Home / Self Care | Payer: 59 | Source: Home / Self Care | Attending: Emergency Medicine | Admitting: Emergency Medicine

## 2023-09-02 ENCOUNTER — Other Ambulatory Visit: Payer: Self-pay

## 2023-09-02 DIAGNOSIS — R0789 Other chest pain: Secondary | ICD-10-CM | POA: Insufficient documentation

## 2023-09-02 DIAGNOSIS — R519 Headache, unspecified: Secondary | ICD-10-CM | POA: Insufficient documentation

## 2023-09-02 DIAGNOSIS — D649 Anemia, unspecified: Secondary | ICD-10-CM | POA: Diagnosis not present

## 2023-09-02 DIAGNOSIS — Z20822 Contact with and (suspected) exposure to covid-19: Secondary | ICD-10-CM | POA: Insufficient documentation

## 2023-09-02 DIAGNOSIS — F17210 Nicotine dependence, cigarettes, uncomplicated: Secondary | ICD-10-CM | POA: Diagnosis not present

## 2023-09-02 DIAGNOSIS — R109 Unspecified abdominal pain: Secondary | ICD-10-CM | POA: Diagnosis present

## 2023-09-02 DIAGNOSIS — N10 Acute tubulo-interstitial nephritis: Secondary | ICD-10-CM | POA: Diagnosis not present

## 2023-09-02 DIAGNOSIS — N390 Urinary tract infection, site not specified: Secondary | ICD-10-CM | POA: Insufficient documentation

## 2023-09-02 DIAGNOSIS — Z6831 Body mass index (BMI) 31.0-31.9, adult: Secondary | ICD-10-CM | POA: Diagnosis not present

## 2023-09-02 DIAGNOSIS — B9629 Other Escherichia coli [E. coli] as the cause of diseases classified elsewhere: Secondary | ICD-10-CM | POA: Diagnosis not present

## 2023-09-02 LAB — URINALYSIS, W/ REFLEX TO CULTURE (INFECTION SUSPECTED)
Bilirubin Urine: NEGATIVE
Glucose, UA: NEGATIVE mg/dL
Hgb urine dipstick: NEGATIVE
Ketones, ur: NEGATIVE mg/dL
Nitrite: POSITIVE — AB
Protein, ur: 30 mg/dL — AB
Specific Gravity, Urine: 1.013 (ref 1.005–1.030)
WBC, UA: >50 WBC/hpf (ref 0–5)
pH: 6 (ref 5.0–8.0)

## 2023-09-02 LAB — HCG, SERUM, QUALITATIVE: Preg, Serum: NEGATIVE

## 2023-09-02 LAB — CBC
HCT: 31.9 % — ABNORMAL LOW (ref 36.0–46.0)
Hemoglobin: 9.7 g/dL — ABNORMAL LOW (ref 12.0–15.0)
MCH: 24.7 pg — ABNORMAL LOW (ref 26.0–34.0)
MCHC: 30.4 g/dL (ref 30.0–36.0)
MCV: 81.4 fL (ref 80.0–100.0)
Platelets: 355 10*3/uL (ref 150–400)
RBC: 3.92 MIL/uL (ref 3.87–5.11)
RDW: 16.3 % — ABNORMAL HIGH (ref 11.5–15.5)
WBC: 6 10*3/uL (ref 4.0–10.5)
nRBC: 0 % (ref 0.0–0.2)

## 2023-09-02 LAB — HEPATIC FUNCTION PANEL
ALT: 16 U/L (ref 0–44)
AST: 17 U/L (ref 15–41)
Albumin: 3.2 g/dL — ABNORMAL LOW (ref 3.5–5.0)
Alkaline Phosphatase: 87 U/L (ref 38–126)
Bilirubin, Direct: <0.1 mg/dL (ref 0.0–0.2)
Total Bilirubin: 0.3 mg/dL (ref 0.3–1.2)
Total Protein: 7.1 g/dL (ref 6.5–8.1)

## 2023-09-02 LAB — TROPONIN I (HIGH SENSITIVITY)
Troponin I (High Sensitivity): 7 ng/L (ref <18)
Troponin I (High Sensitivity): 4 ng/L (ref <18)

## 2023-09-02 LAB — BASIC METABOLIC PANEL
Anion gap: 11 (ref 5–15)
BUN: 5 mg/dL — ABNORMAL LOW (ref 6–20)
CO2: 23 mmol/L (ref 22–32)
Calcium: 9.5 mg/dL (ref 8.9–10.3)
Chloride: 104 mmol/L (ref 98–111)
Creatinine, Ser: 0.61 mg/dL (ref 0.44–1.00)
GFR, Estimated: >60 mL/min (ref >60)
Glucose, Bld: 89 mg/dL (ref 70–99)
Potassium: 3.5 mmol/L (ref 3.5–5.1)
Sodium: 138 mmol/L (ref 135–145)

## 2023-09-02 LAB — RESP PANEL BY RT-PCR (RSV, FLU A&B, COVID)  RVPGX2
Influenza A by PCR: NEGATIVE
Influenza B by PCR: NEGATIVE
Resp Syncytial Virus by PCR: NEGATIVE
SARS Coronavirus 2 by RT PCR: NEGATIVE

## 2023-09-02 LAB — LIPASE, BLOOD: Lipase: 29 U/L (ref 11–51)

## 2023-09-02 MED ORDER — IOHEXOL 350 MG/ML SOLN
75.0000 mL | Freq: Once | INTRAVENOUS | Status: AC | PRN
  Administered 2023-09-02: 75 mL via INTRAVENOUS

## 2023-09-02 MED ORDER — CEPHALEXIN 500 MG PO CAPS: 500.0000 mg | ORAL_CAPSULE | Freq: Three times a day (TID) | ORAL | 0 refills | Status: DC

## 2023-09-02 MED ORDER — KETOROLAC TROMETHAMINE 15 MG/ML IJ SOLN
15.0000 mg | Freq: Once | INTRAMUSCULAR | Status: AC
  Administered 2023-09-02: 15 mg via INTRAVENOUS
  Filled 2023-09-02: qty 1

## 2023-09-02 MED ORDER — SODIUM CHLORIDE 0.9 % IV SOLN
1.0000 g | Freq: Once | INTRAVENOUS | Status: AC
  Administered 2023-09-02: 1 g via INTRAVENOUS
  Filled 2023-09-02: qty 10

## 2023-09-02 NOTE — ED Triage Notes (Signed)
Pt arrived POV from home c/o right sided CP x3 days along with headaches and fevers. Pt states the CP radiates to her back and right shoulder, pt also endorses SHOB, N/V. Pt also states she feels extremely thirsty like she could be dehydrated.

## 2023-09-02 NOTE — ED Provider Notes (Signed)
[place] EMERGENCY DEPARTMENT AT [facility] Provider Note   CSN: [id number] Arrival date & time: 09/02/23  2130     History  Chief Complaint  Patient presents with   Chest Pain   Headache   Fever    Breanna Carlson is a 47 y.o. female.  HPI 47 year old female presents with a chief complaint of chest pain.  She has been had chest pain for the last couple days.  It is constant in the middle of her chest.  Feels like a heaviness.  She has had a fever for over 1 week and up to 2.  Most recently had a temperature of about 102 this morning.  It has been coming and going and she has been taking over-the-counter meds for it.  She denies cough, congestion, sore throat.  She has had a couple weeks of dysuria though it is not as bad as when she first had it.  She is also having lower abdominal pain that is constant but worsens with urination.  No neck stiffness.  Is having a right-sided headache that she has had ever since the fever started.  Feels like is behind her eye.  It is mild in intensity.  No focal weakness or numbness.  No rash or cellulitis. Patient feels like her chest is sore to the touch when she touches it.   Home Medications Prior to Admission medications   Medication Sig Start Date End Date Taking? Authorizing Provider  naproxen (NAPROSYN) 500 MG tablet Take 1 tablet (500 mg total) by mouth 2 (two) times daily as needed for headache. 02/18/20   Wieters, Hallie C, PA-C      Allergies    Patient has no known allergies.    Review of Systems   Review of Systems  Constitutional:  Positive for fever.  HENT:  Negative for congestion and sore throat.   Respiratory:  Negative for cough and shortness of breath.   Cardiovascular:  Positive for chest pain.  Gastrointestinal:  Positive for abdominal pain. Negative for vomiting.  Genitourinary:  Positive for dysuria.  Musculoskeletal:  Negative for neck stiffness.  Neurological:  Positive for headaches.     Physical Exam Updated Vital Signs BP (!) 140/83   Pulse 68   Temp 98.6 F (37 C) (Oral)   Resp 13   Ht 5\' 6"  (1.676 m)   Wt 88.5 kg   SpO2 100%   BMI 31.47 kg/m  Physical Exam Vitals and nursing note reviewed.  Constitutional:      Appearance: She is well-developed.  HENT:     Head: Normocephalic and atraumatic.  Eyes:     Extraocular Movements: Extraocular movements intact.  Cardiovascular:     Rate and Rhythm: Normal rate and regular rhythm.     Pulses:          Dorsalis pedis pulses are 2+ on the right side and 2+ on the left side.     Heart sounds: Normal heart sounds.  Pulmonary:     Effort: Pulmonary effort is normal.     Breath sounds: Normal breath sounds. No decreased breath sounds, wheezing or rhonchi.  Abdominal:     Palpations: Abdomen is soft.     Tenderness: There is abdominal tenderness (mild) in the suprapubic area and left lower quadrant. There is no right CVA tenderness or left CVA tenderness.  Musculoskeletal:     Cervical back: Normal range of motion and neck supple.  Skin:    General: Skin is warm  and dry.     Findings: No erythema.  Neurological:     Mental Status: She is alert.     Comments: CN 3-12 grossly intact. 5/5 strength in all 4 extremities. Grossly normal sensation. Normal finger to nose.      ED Results / Procedures / Treatments   Labs (all labs ordered are listed, but only abnormal results are displayed) Labs Reviewed  BASIC METABOLIC PANEL - Abnormal; Notable for the following components:      Result Value   BUN 5 (*)    All other components within normal limits  CBC - Abnormal; Notable for the following components:   Hemoglobin 9.7 (*)    HCT 31.9 (*)    MCH 24.7 (*)    RDW 16.3 (*)    All other components within normal limits  HEPATIC FUNCTION PANEL - Abnormal; Notable for the following components:   Albumin 3.2 (*)    All other components within normal limits  URINALYSIS, W/ REFLEX TO CULTURE (INFECTION SUSPECTED)  - Abnormal; Notable for the following components:   APPearance HAZY (*)    Protein, ur 30 (*)    Nitrite POSITIVE (*)    Leukocytes,Ua LARGE (*)    Bacteria, UA MANY (*)    All other components within normal limits  RESP PANEL BY RT-PCR (RSV, FLU A&B, COVID)  [id number]  CULTURE, BLOOD (ROUTINE X 2)  CULTURE, BLOOD (ROUTINE X 2)  URINE CULTURE  HCG, SERUM, QUALITATIVE  LIPASE, BLOOD  TROPONIN I (HIGH SENSITIVITY)  TROPONIN I (HIGH SENSITIVITY)    EKG EKG Interpretation Date/Time:  Sunday September 02 2023 10:21:07 EDT Ventricular Rate:  74 PR Interval:  142 QRS Duration:  76 QT Interval:  378 QTC Calculation: 419 R Axis:   47  Text Interpretation: Normal sinus rhythm Nonspecific T wave abnormality Abnormal ECG No previous ECGs available Confirmed by Pricilla Loveless [id number]) on 09/02/2023 11:40:27 AM  Radiology DG Chest 2 View  Result Date: 09/02/2023 CLINICAL DATA:  Chest pain. EXAM: CHEST - 2 VIEW COMPARISON:  12/23/2018. FINDINGS: Bilateral lung fields are clear. Bilateral costophrenic angles are clear. Normal cardio-mediastinal silhouette. No acute osseous abnormalities. The soft tissues are within normal limits. IMPRESSION: No active cardiopulmonary disease. Electronically Signed   By: Jules Schick M.D.   On: 09/02/2023 13:51    Procedures Procedures    Medications Ordered in ED Medications  cefTRIAXone (ROCEPHIN) 1 g in sodium chloride 0.9 % 100 mL IVPB (has no administration in time range)  ketorolac (TORADOL) 15 MG/ML injection 15 mg (15 mg Intravenous Given 09/02/23 1318)    ED Course/ Medical Decision Making/ A&P                                 Medical Decision Making Amount and/or Complexity of Data Reviewed Labs: ordered.    Details: Normal WBC, normal troponins Radiology: ordered.    Details: No pneumonia ECG/medicine tests: ordered and independent interpretation performed.    Details: No ischemia  Risk Prescription drug management.   Unclear why  patient is having chest pain.  Troponins are negative, chest x-ray clear and EKG benign.  However she does have dysuria and a positive UTI.  Was given Rocephin.  However given her more lateral symptoms in her abdomen she will get a CT.  Care transferred to Dr. Lockie Mola.        Final Clinical Impression(s) / ED Diagnoses Final diagnoses:  None  Rx / DC Orders ED Discharge Orders     None         Pricilla Loveless, MD 09/02/23 [phone]

## 2023-09-02 NOTE — ED Provider Notes (Addendum)
Patient here with chest pain, UTI symptoms, left lower abdominal pain.  She appears to have UTI.  Plan is to get CT scan of abdomen pelvis to evaluate for diverticulitis, kidney stone.  Lab work is otherwise unremarkable.  EKG and troponins unremarkable.  Chest x-ray with no evidence of pneumonia.  She does not appear to be septic.  She is feeling much better after Toradol and she is given a dose of Rocephin.  Plan is for discharge home if CT images unremarkable.  Per radiology report CT scan consistent with acute cystitis may be an aspect of pyelonephritis.  Overall this clinically fits but she is feeling much better.  There is no concern for sepsis given labs and vitals.  She is able to tolerate p.o. well.  Will discharge her on Keflex.  Blood cultures have already been drawn to be conservative, urine cx as well.  Discharged in good condition.  She understands return precautions.  This chart was dictated using voice recognition software.  Despite best efforts to proofread,  errors can occur which can change the documentation meaning.    Virgina Norfolk, DO 09/02/23 1711    Virgina Norfolk, DO 09/02/23 1711

## 2023-09-03 ENCOUNTER — Encounter (HOSPITAL_COMMUNITY): Payer: Self-pay | Admitting: Emergency Medicine

## 2023-09-03 ENCOUNTER — Observation Stay (HOSPITAL_COMMUNITY)
Admission: EM | Admit: 2023-09-03 | Discharge: 2023-09-05 | Disposition: A | Discharge status: Home / Self Care | Payer: 59 | Attending: Internal Medicine | Admitting: Internal Medicine

## 2023-09-03 DIAGNOSIS — B9620 Unspecified Escherichia coli [E. coli] as the cause of diseases classified elsewhere: Secondary | ICD-10-CM | POA: Diagnosis present

## 2023-09-03 DIAGNOSIS — Z8249 Family history of ischemic heart disease and other diseases of the circulatory system: Secondary | ICD-10-CM | POA: Diagnosis not present

## 2023-09-03 DIAGNOSIS — E669 Obesity, unspecified: Secondary | ICD-10-CM | POA: Diagnosis present

## 2023-09-03 DIAGNOSIS — Z6831 Body mass index (BMI) 31.0-31.9, adult: Secondary | ICD-10-CM

## 2023-09-03 DIAGNOSIS — R7881 Bacteremia: Secondary | ICD-10-CM | POA: Diagnosis present

## 2023-09-03 DIAGNOSIS — N12 Tubulo-interstitial nephritis, not specified as acute or chronic: Principal | ICD-10-CM

## 2023-09-03 DIAGNOSIS — Z1152 Encounter for screening for COVID-19: Secondary | ICD-10-CM

## 2023-09-03 DIAGNOSIS — Z833 Family history of diabetes mellitus: Secondary | ICD-10-CM | POA: Diagnosis not present

## 2023-09-03 DIAGNOSIS — F17200 Nicotine dependence, unspecified, uncomplicated: Secondary | ICD-10-CM | POA: Diagnosis present

## 2023-09-03 DIAGNOSIS — B9629 Other Escherichia coli [E. coli] as the cause of diseases classified elsewhere: Principal | ICD-10-CM | POA: Insufficient documentation

## 2023-09-03 DIAGNOSIS — D649 Anemia, unspecified: Secondary | ICD-10-CM | POA: Insufficient documentation

## 2023-09-03 DIAGNOSIS — N390 Urinary tract infection, site not specified: Secondary | ICD-10-CM | POA: Diagnosis present

## 2023-09-03 DIAGNOSIS — D509 Iron deficiency anemia, unspecified: Secondary | ICD-10-CM | POA: Diagnosis present

## 2023-09-03 DIAGNOSIS — F17210 Nicotine dependence, cigarettes, uncomplicated: Secondary | ICD-10-CM | POA: Diagnosis present

## 2023-09-03 DIAGNOSIS — N10 Acute tubulo-interstitial nephritis: Principal | ICD-10-CM | POA: Diagnosis present

## 2023-09-03 LAB — COMPREHENSIVE METABOLIC PANEL
ALT: 15 U/L (ref 0–44)
AST: 19 U/L (ref 15–41)
Albumin: 3.4 g/dL — ABNORMAL LOW (ref 3.5–5.0)
Alkaline Phosphatase: 101 U/L (ref 38–126)
Anion gap: 8 (ref 5–15)
BUN: <5 mg/dL — ABNORMAL LOW (ref 6–20)
CO2: 26 mmol/L (ref 22–32)
Calcium: 9.5 mg/dL (ref 8.9–10.3)
Chloride: 105 mmol/L (ref 98–111)
Creatinine, Ser: 0.73 mg/dL (ref 0.44–1.00)
GFR, Estimated: >60 mL/min (ref >60)
Glucose, Bld: 102 mg/dL — ABNORMAL HIGH (ref 70–99)
Potassium: 3.6 mmol/L (ref 3.5–5.1)
Sodium: 139 mmol/L (ref 135–145)
Total Bilirubin: 0.5 mg/dL (ref 0.3–1.2)
Total Protein: 7.6 g/dL (ref 6.5–8.1)

## 2023-09-03 LAB — CBC WITH DIFFERENTIAL/PLATELET
Abs Immature Granulocytes: 0.02 10*3/uL (ref 0.00–0.07)
Basophils Absolute: 0 10*3/uL (ref 0.0–0.1)
Basophils Relative: 1 %
Eosinophils Absolute: 0 10*3/uL (ref 0.0–0.5)
Eosinophils Relative: 0 %
HCT: 32.9 % — ABNORMAL LOW (ref 36.0–46.0)
Hemoglobin: 10.1 g/dL — ABNORMAL LOW (ref 12.0–15.0)
Immature Granulocytes: 0 %
Lymphocytes Relative: 17 %
Lymphs Abs: 1.2 10*3/uL (ref 0.7–4.0)
MCH: 25.1 pg — ABNORMAL LOW (ref 26.0–34.0)
MCHC: 30.7 g/dL (ref 30.0–36.0)
MCV: 81.6 fL (ref 80.0–100.0)
Monocytes Absolute: 0.6 10*3/uL (ref 0.1–1.0)
Monocytes Relative: 8 %
Neutro Abs: 5.1 10*3/uL (ref 1.7–7.7)
Neutrophils Relative %: 74 %
Platelets: 377 10*3/uL (ref 150–400)
RBC: 4.03 MIL/uL (ref 3.87–5.11)
RDW: 16.2 % — ABNORMAL HIGH (ref 11.5–15.5)
WBC: 7 10*3/uL (ref 4.0–10.5)
nRBC: 0 % (ref 0.0–0.2)

## 2023-09-03 LAB — LACTIC ACID, PLASMA
Lactic Acid, Venous: 0.8 mmol/L (ref 0.5–1.9)
Lactic Acid, Venous: 0.6 mmol/L (ref 0.5–1.9)

## 2023-09-03 LAB — BLOOD CULTURE ID PANEL (REFLEXED) - BCID2

## 2023-09-03 LAB — HIV ANTIBODY (ROUTINE TESTING W REFLEX): HIV Screen 4th Generation wRfx: NONREACTIVE

## 2023-09-03 MED ORDER — DOCUSATE SODIUM 100 MG PO CAPS
100.0000 mg | ORAL_CAPSULE | Freq: Two times a day (BID) | ORAL | Status: DC
Start: 2023-09-03 — End: 2023-09-05
  Administered 2023-09-03 – 2023-09-05 (×4): 100 mg via ORAL
  Filled 2023-09-03 (×4): qty 1

## 2023-09-03 MED ORDER — NICOTINE 14 MG/24HR TD PT24
14.0000 mg | MEDICATED_PATCH | Freq: Every day | TRANSDERMAL | Status: DC
Start: 2023-09-03 — End: 2023-09-05
  Administered 2023-09-04 – 2023-09-05 (×2): 14 mg via TRANSDERMAL
  Filled 2023-09-03 (×2): qty 1

## 2023-09-03 MED ORDER — HYDRALAZINE HCL 20 MG/ML IJ SOLN: 5.0000 mg | INTRAMUSCULAR | Status: DC | PRN

## 2023-09-03 MED ORDER — ONDANSETRON HCL 4 MG/2ML IJ SOLN: 4.0000 mg | Freq: Four times a day (QID) | INTRAMUSCULAR | Status: DC | PRN

## 2023-09-03 MED ORDER — MORPHINE SULFATE (PF) 2 MG/ML IV SOLN: 2.0000 mg | INTRAVENOUS | Status: DC | PRN

## 2023-09-03 MED ORDER — ENOXAPARIN SODIUM 40 MG/0.4ML IJ SOSY
40.0000 mg | PREFILLED_SYRINGE | INTRAMUSCULAR | Status: DC
  Administered 2023-09-04: 40 mg via SUBCUTANEOUS
  Filled 2023-09-03: qty 0.4

## 2023-09-03 MED ORDER — SODIUM CHLORIDE 0.9 % IV SOLN
2.0000 g | Freq: Once | INTRAVENOUS | Status: AC
  Administered 2023-09-03: 2 g via INTRAVENOUS
  Filled 2023-09-03: qty 20

## 2023-09-03 MED ORDER — OXYCODONE HCL 5 MG PO TABS
5.0000 mg | ORAL_TABLET | ORAL | Status: DC | PRN
  Administered 2023-09-03 – 2023-09-04 (×3): 5 mg via ORAL
  Filled 2023-09-03 (×3): qty 1

## 2023-09-03 MED ORDER — ACETAMINOPHEN 650 MG RE SUPP: 650.0000 mg | Freq: Four times a day (QID) | RECTAL | Status: DC | PRN

## 2023-09-03 MED ORDER — SODIUM CHLORIDE 0.9 % IV SOLN: 2.0000 g | INTRAVENOUS | Status: DC

## 2023-09-03 MED ORDER — POLYETHYLENE GLYCOL 3350 17 G PO PACK: 17.0000 g | PACK | Freq: Every day | ORAL | Status: DC | PRN

## 2023-09-03 MED ORDER — BISACODYL 5 MG PO TBEC
5.0000 mg | DELAYED_RELEASE_TABLET | Freq: Every day | ORAL | Status: DC | PRN
  Administered 2023-09-05: 5 mg via ORAL
  Filled 2023-09-03: qty 1

## 2023-09-03 MED ORDER — ONDANSETRON HCL 4 MG PO TABS: 4.0000 mg | ORAL_TABLET | Freq: Four times a day (QID) | ORAL | Status: DC | PRN

## 2023-09-03 MED ORDER — LACTATED RINGERS IV SOLN: INTRAVENOUS | Status: AC

## 2023-09-03 MED ORDER — ACETAMINOPHEN 325 MG PO TABS
650.0000 mg | ORAL_TABLET | Freq: Four times a day (QID) | ORAL | Status: DC | PRN
  Administered 2023-09-04: 650 mg via ORAL
  Filled 2023-09-03: qty 2

## 2023-09-03 NOTE — ED Provider Notes (Signed)
[place] EMERGENCY DEPARTMENT AT [facility] Provider Note   CSN: [id number] Arrival date & time: 09/03/23  0932     History  Chief Complaint  Patient presents with   Abnormal Lab    Breanna Carlson is a 47 y.o. female.  Patient with noncontributory past medical history presents today with concern for abnormal lab.  She states she was seen here yesterday and diagnosed with pyelonephritis and discharged home with oral antibiotics.  She received a call this morning informing her to return to the department due to positive blood cultures.  She states that she has not felt any improvement since yesterday, however she has not filled or taken any of the oral antibiotics that she was prescribed.  She has not ran a fever since yesterday morning despite not taking any Tylenol or ibuprofen.  Continues to endorse mild generalized abdominal pain and flank pain.   The history is provided by the patient. No language interpreter was used.  Abnormal Lab      Home Medications Prior to Admission medications   Medication Sig Start Date End Date Taking? Authorizing Provider  cephALEXin (KEFLEX) 500 MG capsule Take 1 capsule (500 mg total) by mouth 3 (three) times daily for 7 days. 09/02/23 09/09/23  Curatolo, Adam, DO  naproxen sodium (ALEVE) 220 MG tablet Take 220 mg by mouth daily as needed.    [provider]      Allergies    Patient has no known allergies.    Review of Systems   Review of Systems  All other systems reviewed and are negative.   Physical Exam Updated Vital Signs BP (!) 138/91 (BP Location: Right Arm)   Pulse 80   Temp 98.7 F (37.1 C) (Oral)   Resp 16   SpO2 99%  Physical Exam Vitals and nursing note reviewed.  Constitutional:      General: She is not in acute distress.    Appearance: Normal appearance. She is normal weight. She is not ill-appearing, toxic-appearing or diaphoretic.  HENT:     Head: Normocephalic and atraumatic.   Cardiovascular:     Rate and Rhythm: Normal rate.  Pulmonary:     Effort: Pulmonary effort is normal. No respiratory distress.  Abdominal:     General: Abdomen is flat.     Palpations: Abdomen is soft.  Musculoskeletal:        General: Normal range of motion.     Cervical back: Normal range of motion.  Skin:    General: Skin is warm and dry.  Neurological:     General: No focal deficit present.     Mental Status: She is alert.  Psychiatric:        Mood and Affect: Mood normal.        Behavior: Behavior normal.     ED Results / Procedures / Treatments   Labs (all labs ordered are listed, but only abnormal results are displayed) Labs Reviewed  CULTURE, BLOOD (ROUTINE X 2)  CULTURE, BLOOD (ROUTINE X 2)  CBC WITH DIFFERENTIAL/PLATELET  COMPREHENSIVE METABOLIC PANEL  LACTIC ACID, PLASMA  LACTIC ACID, PLASMA    EKG None  Radiology CT Head Wo Contrast  Result Date: 09/02/2023 CLINICAL DATA:  Head headache, fever EXAM: CT HEAD WITHOUT CONTRAST TECHNIQUE: Contiguous axial images were obtained from the base of the skull through the vertex without intravenous contrast. RADIATION DOSE REDUCTION: This exam was performed according to the departmental dose-optimization program which includes automated exposure control, adjustment of the  mA and/or kV according to patient size and/or use of iterative reconstruction technique. COMPARISON:  None Available. FINDINGS: Brain: No evidence of acute infarction, hemorrhage, hydrocephalus, extra-axial collection or mass lesion/mass effect. Vascular: No hyperdense vessel or unexpected calcification. Skull: Normal. Negative for fracture or focal lesion. Sinuses/Orbits: No acute finding. Other: Mastoid air cells and middle ear cavities are clear. IMPRESSION: 1. No acute intracranial abnormality. Electronically Signed   By: Helyn Numbers M.D.   On: 09/02/2023 16:52   CT ABDOMEN PELVIS W CONTRAST  Result Date: 09/02/2023 CLINICAL DATA:  Left lower  quadrant abdominal pain EXAM: CT ABDOMEN AND PELVIS WITH CONTRAST TECHNIQUE: Multidetector CT imaging of the abdomen and pelvis was performed using the standard protocol following bolus administration of intravenous contrast. RADIATION DOSE REDUCTION: This exam was performed according to the departmental dose-optimization program which includes automated exposure control, adjustment of the mA and/or kV according to patient size and/or use of iterative reconstruction technique. CONTRAST:  75mL OMNIPAQUE IOHEXOL 350 MG/ML SOLN COMPARISON:  None Available. FINDINGS: Lower chest: No acute abnormality. Hepatobiliary: Focal fatty hepatic infiltration adjacent to the falciform ligament. No enhancing intrahepatic mass. No gallstones, gallbladder wall thickening, or biliary dilatation. Pancreas: Unremarkable Spleen: Unremarkable Adrenals/Urinary Tract: The adrenal glands are unremarkable. The kidneys are normal in size and position. There is asymmetric mild left perinephric and periureteric inflammatory stranding which is nonspecific, but suggests a unilateral inflammatory process, most commonly ascending urinary tract infection/pyelonephritis in a patient of this age. No hydronephrosis. No intrarenal or ureteral calculi. The right kidney is unremarkable. The bladder is decompressed and. There is, however, superimposed circumferential perivesicular inflammatory stranding, best appreciated on sagittal images, suggesting a diffuse infectious or inflammatory cystitis. Stomach/Bowel: The stomach, small bowel, and large bowel are unremarkable. Appendix normal. No free intraperitoneal gas or fluid. Vascular/Lymphatic: Mild aortoiliac atherosclerotic calcification. No aortic aneurysm. No pathologic adenopathy within the abdomen and pelvis. Reproductive: Uterus and bilateral adnexa are unremarkable. Other: Tiny bilateral fat containing inguinal hernias are present Musculoskeletal: No acute or significant osseous findings.  IMPRESSION: Inflammatory changes in keeping with probable infectious cystitis and left-sided ascending urinary tract infection/pyelonephritis. Correlation with urinalysis and urine culture is recommended. No hydronephrosis. Electronically Signed   By: Helyn Numbers M.D.   On: 09/02/2023 16:49   DG Chest 2 View  Result Date: 09/02/2023 CLINICAL DATA:  Chest pain. EXAM: CHEST - 2 VIEW COMPARISON:  12/23/2018. FINDINGS: Bilateral lung fields are clear. Bilateral costophrenic angles are clear. Normal cardio-mediastinal silhouette. No acute osseous abnormalities. The soft tissues are within normal limits. IMPRESSION: No active cardiopulmonary disease. Electronically Signed   By: Jules Schick M.D.   On: 09/02/2023 13:51    Procedures Procedures    Medications Ordered in ED Medications  cefTRIAXone (ROCEPHIN) 2 g in sodium chloride 0.9 % 100 mL IVPB (has no administration in time range)    ED Course/ Medical Decision Making/ A&P                                 Medical Decision Making Amount and/or Complexity of Data Reviewed Labs: ordered.   This patient is a 47 y.o. female who presents to the ED for concern of abdominal pain/flank pain, this involves an extensive number of treatment options, and is a complaint that carries with it a high risk of complications and morbidity. The emergent differential diagnosis prior to evaluation includes, but is not limited to,  sepsis, pyelonephritis. This is not  an exhaustive differential.   Past Medical History / Co-morbidities / Social History:  has no past medical history on file.  Additional history: Chart reviewed. Pertinent results include: Seen yesterday, diagnosed with pyelonephritis, blood cultures grew E. coli.  Patient called to come back for management of same.  Physical Exam: Physical exam performed. The pertinent findings include: No acute physical exam abnormalities.  Lab Tests: I ordered, and personally interpreted labs.  The  pertinent results include: No acute laboratory abnormalities.  Lactic normal.  Blood cultures pending.   Medications: I ordered medication including Rocephin for pyelonephritis. Reevaluation of the patient after these medicines showed that the patient improved. I have reviewed the patients home medicines and have made adjustments as needed.  Disposition: After consideration of the diagnostic results and the patients response to treatment, I feel that patient will require admission for pyelonephritis with positive blood cultures growing E. coli which is consistent with same.  Given IV antibiotics for same.  Patient is understanding and in agreement with plan.   Discussed patient with hospitalist Dr. Ophelia Charter who accepts patient for admission.  Final Clinical Impression(s) / ED Diagnoses Final diagnoses:  Pyelonephritis  Positive blood cultures    Rx / DC Orders ED Discharge Orders     None         Vear Clock 09/03/23 1611    Laurence Spates, MD 09/04/23 1921

## 2023-09-03 NOTE — H&P (Signed)
History and Physical    Patient: Breanna Carlson ZHY:[id number] DOB: Feb 13, 1976 DOA: 09/03/2023 DOS: the patient was seen and examined on 09/03/2023 PCP: Patient, No Pcp Per  Patient coming from: Home - lives with fiance, daughter; NOK: Fiance (did not request that I call)   Chief Complaint: Bacteremia  HPI: Breanna Carlson is a 47 y.o. female without known medical history presenting with + blood cultures.  She was seen in the ER 10/20 (yesterday) with UTI symptoms and CT with acute cystitis, possible pyleo.  She was given Toradol and Rocephin with improvement but blood cultures positive so she was called to come back.  She reports about 2 weeks of generalized malaise and lethargy.  +dysuria, frequency, urgency.  Fever to 102.  L flank pain, now radiating to R flank.  She came in yesterday and was given ceftriaxone x 1; cephalexin was called in but she did not have time to go pick it up before being called to come back for bacteremia.    ER Course:  Pyelo with + blood cultures.  Seen yesterday, CT with ?pyelo.  Did not start Keflex.  Blood cultures with E coli.  Not feeling better.  Given Ceftriaxone.     Review of Systems: As mentioned in the history of present illness. All other systems reviewed and are negative. History reviewed. No pertinent past medical history. History reviewed. No pertinent surgical history. Social History:  reports that she has been smoking cigarettes. She has never used smokeless tobacco. She reports current alcohol use. She reports that she does not use drugs.  No Known Allergies  Family History  Problem Relation Age of Onset   Diabetes Mother    Hypertension Mother    Seizures Mother     Prior to Admission medications   Medication Sig Start Date End Date Taking? Authorizing Provider  cephALEXin (KEFLEX) 500 MG capsule Take 1 capsule (500 mg total) by mouth 3 (three) times daily for 7 days. 09/02/23 09/09/23  Curatolo, Adam, DO  naproxen  sodium (ALEVE) 220 MG tablet Take 220 mg by mouth daily as needed.    [provider]    Physical Exam: Vitals:   09/03/23 0945 09/03/23 1200 09/03/23 1230 09/03/23 1421  BP: (!) 138/91 (!) 145/86 (!) 126/110   Pulse: 80 64 74   Resp: 16 17 14    Temp: 98.7 F (37.1 C)     TempSrc: Oral     SpO2: 99% 99% 100%   Weight:    87.6 kg  Height:    5\' 6"  (1.676 m)   General:  Appears calm and comfortable and is in NAD Eyes:   EOMI, normal lids, iris ENT:  grossly normal hearing, lips & tongue, mmm; appropriate dentition with partials Neck:  no LAD, masses or thyromegaly Cardiovascular:  RRR, no m/r/g. No LE edema.  Respiratory:   CTA bilaterally with no wheezes/rales/rhonchi.  Normal respiratory effort. Abdomen:  soft, +suprapubic TTP, ND Back:   no L CVAT Skin:  no rash or induration seen on limited exam Musculoskeletal:  grossly normal tone BUE/BLE, good ROM, no bony abnormality Psychiatric:  grossly normal mood and affect, speech fluent and appropriate, AOx3 Neurologic:  CN 2-12 grossly intact, moves all extremities in coordinated fashion   Radiological Exams on Admission: Independently reviewed - see discussion in A/P where applicable  CT Head Wo Contrast  Result Date: 09/02/2023 CLINICAL DATA:  Head headache, fever EXAM: CT HEAD WITHOUT CONTRAST TECHNIQUE: Contiguous axial images were obtained from the  base of the skull through the vertex without intravenous contrast. RADIATION DOSE REDUCTION: This exam was performed according to the departmental dose-optimization program which includes automated exposure control, adjustment of the mA and/or kV according to patient size and/or use of iterative reconstruction technique. COMPARISON:  None Available. FINDINGS: Brain: No evidence of acute infarction, hemorrhage, hydrocephalus, extra-axial collection or mass lesion/mass effect. Vascular: No hyperdense vessel or unexpected calcification. Skull: Normal. Negative for fracture or  focal lesion. Sinuses/Orbits: No acute finding. Other: Mastoid air cells and middle ear cavities are clear. IMPRESSION: 1. No acute intracranial abnormality. Electronically Signed   By: Helyn Numbers M.D.   On: 09/02/2023 16:52   CT ABDOMEN PELVIS W CONTRAST  Result Date: 09/02/2023 CLINICAL DATA:  Left lower quadrant abdominal pain EXAM: CT ABDOMEN AND PELVIS WITH CONTRAST TECHNIQUE: Multidetector CT imaging of the abdomen and pelvis was performed using the standard protocol following bolus administration of intravenous contrast. RADIATION DOSE REDUCTION: This exam was performed according to the departmental dose-optimization program which includes automated exposure control, adjustment of the mA and/or kV according to patient size and/or use of iterative reconstruction technique. CONTRAST:  75mL OMNIPAQUE IOHEXOL 350 MG/ML SOLN COMPARISON:  None Available. FINDINGS: Lower chest: No acute abnormality. Hepatobiliary: Focal fatty hepatic infiltration adjacent to the falciform ligament. No enhancing intrahepatic mass. No gallstones, gallbladder wall thickening, or biliary dilatation. Pancreas: Unremarkable Spleen: Unremarkable Adrenals/Urinary Tract: The adrenal glands are unremarkable. The kidneys are normal in size and position. There is asymmetric mild left perinephric and periureteric inflammatory stranding which is nonspecific, but suggests a unilateral inflammatory process, most commonly ascending urinary tract infection/pyelonephritis in a patient of this age. No hydronephrosis. No intrarenal or ureteral calculi. The right kidney is unremarkable. The bladder is decompressed and. There is, however, superimposed circumferential perivesicular inflammatory stranding, best appreciated on sagittal images, suggesting a diffuse infectious or inflammatory cystitis. Stomach/Bowel: The stomach, small bowel, and large bowel are unremarkable. Appendix normal. No free intraperitoneal gas or fluid. Vascular/Lymphatic:  Mild aortoiliac atherosclerotic calcification. No aortic aneurysm. No pathologic adenopathy within the abdomen and pelvis. Reproductive: Uterus and bilateral adnexa are unremarkable. Other: Tiny bilateral fat containing inguinal hernias are present Musculoskeletal: No acute or significant osseous findings. IMPRESSION: Inflammatory changes in keeping with probable infectious cystitis and left-sided ascending urinary tract infection/pyelonephritis. Correlation with urinalysis and urine culture is recommended. No hydronephrosis. Electronically Signed   By: Helyn Numbers M.D.   On: 09/02/2023 16:49   DG Chest 2 View  Result Date: 09/02/2023 CLINICAL DATA:  Chest pain. EXAM: CHEST - 2 VIEW COMPARISON:  12/23/2018. FINDINGS: Bilateral lung fields are clear. Bilateral costophrenic angles are clear. Normal cardio-mediastinal silhouette. No acute osseous abnormalities. The soft tissues are within normal limits. IMPRESSION: No active cardiopulmonary disease. Electronically Signed   By: Jules Schick M.D.   On: 09/02/2023 13:51    EKG: none today   Labs on Admission: I have personally reviewed the available labs and imaging studies at the time of the admission.  Pertinent labs:    Unremarkable CMP WBC 7 Hgb 10.1 Upreg negative COVID/flu/RSV negative 10/20 UA 10/20: large LE, + nitrite, 30 protein, many bacteria Urine culture, blood culture both + E coli   Assessment and Plan: Principal Problem:   E coli bacteremia Active Problems:   Acute pyelonephritis   Tobacco dependence    E coli pyelonephritis with bacteremia -Patient presenting without sepsis physiology but with symptoms concerning for UTI with + imaging yesterday, now with bacteremia -Urine and blood cultures both +  for E coli -Will admit to med surg -Will continue treatment with Rocephin (received first dose in the ER yesterday, second dose today) -She was unable to fill rx prior to being recalled to the ER -No current concern for  additional complications  Tobacco dependence -Encourage cessation.   -This was discussed with the patient and should be reviewed on an ongoing basis.   -Patch ordered at patient request.    Advance Care Planning:   Code Status: Full Code - Code status was discussed with the patient at the time of admission.  The patient would want to receive full resuscitative measures at this time.   Consults: None  DVT Prophylaxis: Lovenox  Family Communication: None present; she declined to have me call anyone at the time of admission  Severity of Illness: The appropriate patient status for this patient is INPATIENT. Inpatient status is judged to be reasonable and necessary in order to provide the required intensity of service to ensure the patient's safety. The patient's presenting symptoms, physical exam findings, and initial radiographic and laboratory data in the context of their chronic comorbidities is felt to place them at high risk for further clinical deterioration. Furthermore, it is not anticipated that the patient will be medically stable for discharge from the hospital within 2 midnights of admission.   * I certify that at the point of admission it is my clinical judgment that the patient will require inpatient hospital care spanning beyond 2 midnights from the point of admission due to high intensity of service, high risk for further deterioration and high frequency of surveillance required.*  Author: Jonah Blue, MD 09/03/2023 2:51 PM  For on call review [email or web address].

## 2023-09-03 NOTE — Plan of Care (Signed)

## 2023-09-03 NOTE — ED Triage Notes (Signed)
Pt to the ER with reports of being called and told she had positive blood culture. Pt diagnosed with pyelonephritis yesterday. Denies fevers today.

## 2023-09-03 NOTE — Plan of Care (Signed)
  Problem: Activity: Goal: Risk for activity intolerance will decrease Outcome: Progressing   Problem: Coping: Goal: Level of anxiety will decrease Outcome: Progressing   Problem: Pain Managment: Goal: General experience of comfort will improve Outcome: Progressing   Problem: Safety: Goal: Ability to remain free from injury will improve Outcome: Progressing   Problem: Skin Integrity: Goal: Risk for impaired skin integrity will decrease Outcome: Progressing   

## 2023-09-03 NOTE — ED Notes (Signed)
Called and spoke with patient to return to ed for IV antibiotics per Dr. Mitchel Honour , patient will return.

## 2023-09-04 DIAGNOSIS — B9620 Unspecified Escherichia coli [E. coli] as the cause of diseases classified elsewhere: Secondary | ICD-10-CM | POA: Diagnosis not present

## 2023-09-04 DIAGNOSIS — R7881 Bacteremia: Secondary | ICD-10-CM | POA: Diagnosis not present

## 2023-09-04 LAB — CBC
HCT: 29.8 % — ABNORMAL LOW (ref 36.0–46.0)
Hemoglobin: 9.4 g/dL — ABNORMAL LOW (ref 12.0–15.0)
MCH: 25.8 pg — ABNORMAL LOW (ref 26.0–34.0)
MCHC: 31.5 g/dL (ref 30.0–36.0)
MCV: 81.9 fL (ref 80.0–100.0)
Platelets: 377 10*3/uL (ref 150–400)
RBC: 3.64 MIL/uL — ABNORMAL LOW (ref 3.87–5.11)
RDW: 15.9 % — ABNORMAL HIGH (ref 11.5–15.5)
WBC: 6.3 10*3/uL (ref 4.0–10.5)
nRBC: 0 % (ref 0.0–0.2)

## 2023-09-04 LAB — FERRITIN: Ferritin: 6 ng/mL — ABNORMAL LOW (ref 11–307)

## 2023-09-04 LAB — RETICULOCYTES
Immature Retic Fract: 19.1 % — ABNORMAL HIGH (ref 2.3–15.9)
RBC.: 3.89 MIL/uL (ref 3.87–5.11)
Retic Count, Absolute: 60.3 10*3/uL (ref 19.0–186.0)
Retic Ct Pct: 1.6 % (ref 0.4–3.1)

## 2023-09-04 LAB — IRON AND TIBC
Iron: 21 ug/dL — ABNORMAL LOW (ref 28–170)
Saturation Ratios: 5 % — ABNORMAL LOW (ref 10.4–31.8)
TIBC: 420 ug/dL (ref 250–450)
UIBC: 399 ug/dL

## 2023-09-04 LAB — URINE CULTURE: Culture: >=100000 — AB

## 2023-09-04 LAB — BASIC METABOLIC PANEL
Anion gap: 10 (ref 5–15)
BUN: 7 mg/dL (ref 6–20)
CO2: 25 mmol/L (ref 22–32)
Calcium: 9.3 mg/dL (ref 8.9–10.3)
Chloride: 101 mmol/L (ref 98–111)
Creatinine, Ser: 0.63 mg/dL (ref 0.44–1.00)
GFR, Estimated: >60 mL/min (ref >60)
Glucose, Bld: 92 mg/dL (ref 70–99)
Potassium: 3.5 mmol/L (ref 3.5–5.1)
Sodium: 136 mmol/L (ref 135–145)

## 2023-09-04 LAB — VITAMIN B12: Vitamin B-12: 341 pg/mL (ref 180–914)

## 2023-09-04 LAB — FOLATE: Folate: 7.8 ng/mL (ref >5.9)

## 2023-09-04 MED ORDER — SODIUM CHLORIDE 0.9 % IV SOLN
2.0000 g | INTRAVENOUS | Status: DC
  Administered 2023-09-04 – 2023-09-05 (×2): 2 g via INTRAVENOUS
  Filled 2023-09-04 (×2): qty 20

## 2023-09-04 NOTE — Plan of Care (Signed)

## 2023-09-04 NOTE — Progress Notes (Signed)
PROGRESS NOTE    Breanna Carlson  ZOX:[id number] DOB: 03/02/1976 DOA: 09/03/2023 PCP: Patient, No Pcp Per   Brief Narrative: 47 year old without known medical history presents to the ED initially on 10/20 with UTI like symptoms, CT show acute cystitis and possible pyelo-.  Blood cultures were obtained.  She was discharged home with oral antibiotics.  Blood cultures came back positive for E. coli she was advised to presented for admission.  For the last 2 weeks she report malaise lethargic, dysuria fever temperature 102, left flank pain.  Patient now admitted for E. coli bacteremia and pyelo-.   Assessment & Plan:   Principal Problem:   E coli bacteremia Active Problems:   Acute pyelonephritis   Tobacco dependence   1-E. coli bacteremia, Pyelonephritis Sepsis rule out -E. coli bacteremia in the setting of pyelonephritis -Patient presented with dysuria, fever, flank pain, UA with more than 50 WBC.  Initially sent home on oral antibiotics from at the ED but blood cultures came back positive so she was advised to present for admission -Continue with IV ceftriaxone -Urine and blood cultures growing E. Coli Follow sensitivity.   2-tobacco dependence Counseling.   3-anemia: Iron deficiency.  Needs iron supplement at discharge.  Needs Out patient evaluation/screening colonoscopy    Estimated body mass index is 31.17 kg/m as calculated from the following:   Height as of this encounter: 5\' 6"  (1.676 m).   Weight as of this encounter: 87.6 kg.   DVT prophylaxis: Lovenox Code Status: Full code Family Communication: Discussed with patient Disposition Plan:  Status is: Inpatient Remains inpatient appropriate because: Management of pyelonephritis and bacteremia    Consultants:  none  Procedures:  none  Antimicrobials:    Subjective: She reports improvement of flank pain and dysuria.  She has been drinking fluids.  Objective: Vitals:   09/03/23 1421 09/03/23  1542 09/03/23 2019 09/04/23 0533  BP:  138/86 (!) 136/92 122/83  Pulse:  85 85 69  Resp:   18 17  Temp:  99.3 F (37.4 C) 99.5 F (37.5 C) 97.9 F (36.6 C)  TempSrc:  Oral Oral   SpO2:  100% 100% 100%  Weight: 87.6 kg     Height: 5\' 6"  (1.676 m)       Intake/Output Summary (Last 24 hours) at 09/04/2023 0729 Last data filed at 09/03/2023 2119 Gross per 24 hour  Intake 340 ml  Output --  Net 340 ml   Filed Weights   09/03/23 1421  Weight: 87.6 kg    Examination:  General exam: Appears calm and comfortable  Respiratory system: Clear to auscultation. Respiratory effort normal. Cardiovascular system: S1 & S2 heard, RRR. No JVD, murmurs, rubs, gallops or clicks. No pedal edema. Gastrointestinal system: Abdomen is nondistended, soft and nontender. No organomegaly or masses felt. Normal bowel sounds heard. Central nervous system: Alert and oriented. No focal neurological deficits. Extremities: Symmetric 5 x 5 power.    Data Reviewed: I have personally reviewed following labs and imaging studies  CBC: Recent Labs  Lab 09/02/23 1026 09/03/23 1003 09/04/23 0620  WBC 6.0 7.0 6.3  NEUTROABS  --  5.1  --   HGB 9.7* 10.1* 9.4*  HCT 31.9* 32.9* 29.8*  MCV 81.4 81.6 81.9  PLT 355 377 377   Basic Metabolic Panel: Recent Labs  Lab 09/02/23 1026 09/03/23 1003 09/04/23 0620  NA 138 139 136  K 3.5 3.6 3.5  CL 104 105 101  CO2 23 26 25   GLUCOSE 89 102* 92  BUN 5* <5* 7  CREATININE 0.61 0.73 0.63  CALCIUM 9.5 9.5 9.3   GFR: Estimated Creatinine Clearance: 96.9 mL/min (by C-G formula based on SCr of 0.63 mg/dL). Liver Function Tests: Recent Labs  Lab 09/02/23 1253 09/03/23 1003  AST 17 19  ALT 16 15  ALKPHOS 87 101  BILITOT 0.3 0.5  PROT 7.1 7.6  ALBUMIN 3.2* 3.4*   Recent Labs  Lab 09/02/23 1253  LIPASE 29   No results for input(s): "AMMONIA" in the last 168 hours. Coagulation Profile: No results for input(s): "INR", "PROTIME" in the last 168  hours. Cardiac Enzymes: No results for input(s): "CKTOTAL", "CKMB", "CKMBINDEX", "TROPONINI" in the last 168 hours. BNP (last 3 results) No results for input(s): "PROBNP" in the last 8760 hours. HbA1C: No results for input(s): "HGBA1C" in the last 72 hours. CBG: No results for input(s): "GLUCAP" in the last 168 hours. Lipid Profile: No results for input(s): "CHOL", "HDL", "LDLCALC", "TRIG", "CHOLHDL", "LDLDIRECT" in the last 72 hours. Thyroid Function Tests: No results for input(s): "TSH", "T4TOTAL", "FREET4", "T3FREE", "THYROIDAB" in the last 72 hours. Anemia Panel: No results for input(s): "VITAMINB12", "FOLATE", "FERRITIN", "TIBC", "IRON", "RETICCTPCT" in the last 72 hours. Sepsis Labs: Recent Labs  Lab 09/03/23 1004 09/03/23 1415  LATICACIDVEN 0.8 0.6    Recent Results (from the past 240 hour(s))  Culture, blood (routine x 2)     Status: None (Preliminary result)   Collection Time: 09/02/23 12:53 PM   Specimen: BLOOD LEFT ARM  Result Value Ref Range Status   Specimen Description BLOOD LEFT ARM  Final   Special Requests   Final    BOTTLES DRAWN AEROBIC AND ANAEROBIC Blood Culture results may not be optimal due to an excessive volume of blood received in culture bottles   Culture   Final    NO GROWTH 2 DAYS Performed at [facility], [address] [place], [place] [address]    Report Status PENDING  Incomplete  Culture, blood (routine x 2)     Status: None (Preliminary result)   Collection Time: 09/02/23 12:53 PM   Specimen: BLOOD RIGHT ARM  Result Value Ref Range Status   Specimen Description BLOOD RIGHT ARM  Final   Special Requests   Final    BOTTLES DRAWN AEROBIC AND ANAEROBIC Blood Culture results may not be optimal due to an excessive volume of blood received in culture bottles   Culture  Setup Time   Final    GRAM NEGATIVE RODS ANAEROBIC BOTTLE ONLY CRITICAL RESULT CALLED TO, READ BACK BY AND VERIFIED WITH: CAcquanetta Belling RN 09/03/23 @ 0258 BY AB Performed  at [facility], [address], [place], [place] [address]    Culture GRAM NEGATIVE RODS  Final   Report Status PENDING  Incomplete  Resp panel by RT-PCR (RSV, Flu A&B, Covid)     Status: None   Collection Time: 09/02/23 12:53 PM   Specimen: Nasal Swab  Result Value Ref Range Status   SARS Coronavirus 2 by RT PCR NEGATIVE NEGATIVE Final   Influenza A by PCR NEGATIVE NEGATIVE Final   Influenza B by PCR NEGATIVE NEGATIVE Final    Comment: (NOTE) The Xpert Xpress SARS-CoV-2/FLU/RSV plus assay is intended as an aid in the diagnosis of influenza from Nasopharyngeal swab specimens and should not be used as a sole basis for treatment. Nasal washings and aspirates are unacceptable for Xpert Xpress SARS-CoV-2/FLU/RSV testing.  Fact Sheet for Patients: BloggerCourse.com  Fact Sheet for Healthcare Providers:  SeriousBroker.it  This test is not yet approved or cleared by the [place] and has been authorized for detection and/or diagnosis of SARS-CoV-2 by FDA under an Emergency Use Authorization (EUA). This EUA will remain in effect (meaning this test can be used) for the duration of the COVID-19 declaration under Section 564(b)(1) of the Act, 21 U.S.C. section 360bbb-3(b)(1), unless the authorization is terminated or revoked.     Resp Syncytial Virus by PCR NEGATIVE NEGATIVE Final    Comment: (NOTE) Fact Sheet for Patients: BloggerCourse.com  Fact Sheet for Healthcare Providers: SeriousBroker.it  This test is not yet approved or cleared by the [place] FDA and has been authorized for detection and/or diagnosis of SARS-CoV-2 by FDA under an Emergency Use Authorization (EUA). This EUA will remain in effect (meaning this test can be used) for the duration of the COVID-19 declaration under Section 564(b)(1) of the Act, 21 U.S.C. section 360bbb-3(b)(1), unless the  authorization is terminated or revoked.  Performed at [facility], [address] [place], [place] [address]   Urine Culture     Status: Abnormal (Preliminary result)   Collection Time: 09/02/23 12:53 PM   Specimen: Urine, Random  Result Value Ref Range Status   Specimen Description URINE, RANDOM  Final   Special Requests NONE Reflexed from [id number]  Final   Culture (A)  Final    >=100,000 COLONIES/mL ESCHERICHIA COLI SUSCEPTIBILITIES TO FOLLOW Performed at [facility], [address], [place], [place] [address]    Report Status PENDING  Incomplete  Blood Culture ID Panel (Reflexed)     Status: Abnormal   Collection Time: 09/02/23 12:53 PM  Result Value Ref Range Status   Enterococcus faecalis NOT DETECTED NOT DETECTED Final   Enterococcus Faecium NOT DETECTED NOT DETECTED Final   Listeria monocytogenes NOT DETECTED NOT DETECTED Final   Staphylococcus species NOT DETECTED NOT DETECTED Final   Staphylococcus aureus (BCID) NOT DETECTED NOT DETECTED Final   Staphylococcus epidermidis NOT DETECTED NOT DETECTED Final   Staphylococcus lugdunensis NOT DETECTED NOT DETECTED Final   Streptococcus species NOT DETECTED NOT DETECTED Final   Streptococcus agalactiae NOT DETECTED NOT DETECTED Final   Streptococcus pneumoniae NOT DETECTED NOT DETECTED Final   Streptococcus pyogenes NOT DETECTED NOT DETECTED Final   A.calcoaceticus-baumannii NOT DETECTED NOT DETECTED Final   Bacteroides fragilis NOT DETECTED NOT DETECTED Final   Enterobacterales DETECTED (A) NOT DETECTED Final    Comment: Enterobacterales represent a large order of gram negative bacteria, not a single organism. CRITICAL RESULT CALLED TO, READ BACK BY AND VERIFIED WITH: C. GROSE RN 09/03/23 @ 0258 BY AB    Enterobacter cloacae complex NOT DETECTED NOT DETECTED Final   Escherichia coli DETECTED (A) NOT DETECTED Final    Comment: CRITICAL RESULT CALLED TO, READ BACK BY AND VERIFIED WITH: C. GROSE RN 09/03/23 @  0258 BY AB    Klebsiella aerogenes NOT DETECTED NOT DETECTED Final   Klebsiella oxytoca NOT DETECTED NOT DETECTED Final   Klebsiella pneumoniae NOT DETECTED NOT DETECTED Final   Proteus species NOT DETECTED NOT DETECTED Final   Salmonella species NOT DETECTED NOT DETECTED Final   Serratia marcescens NOT DETECTED NOT DETECTED Final   Haemophilus influenzae NOT DETECTED NOT DETECTED Final   Neisseria meningitidis NOT DETECTED NOT DETECTED Final   Pseudomonas aeruginosa NOT DETECTED NOT DETECTED Final   Stenotrophomonas maltophilia NOT DETECTED NOT DETECTED Final   Candida albicans NOT DETECTED NOT DETECTED Final   Candida auris NOT DETECTED  NOT DETECTED Final   Candida glabrata NOT DETECTED NOT DETECTED Final   Candida krusei NOT DETECTED NOT DETECTED Final   Candida parapsilosis NOT DETECTED NOT DETECTED Final   Candida tropicalis NOT DETECTED NOT DETECTED Final   Cryptococcus neoformans/gattii NOT DETECTED NOT DETECTED Final   CTX-M ESBL NOT DETECTED NOT DETECTED Final   Carbapenem resistance IMP NOT DETECTED NOT DETECTED Final   Carbapenem resistance KPC NOT DETECTED NOT DETECTED Final   Carbapenem resistance NDM NOT DETECTED NOT DETECTED Final   Carbapenem resist OXA 48 LIKE NOT DETECTED NOT DETECTED Final   Carbapenem resistance VIM NOT DETECTED NOT DETECTED Final    Comment: Performed at [facility], [address], [place], [place] [address]  Blood culture (routine x 2)     Status: None (Preliminary result)   Collection Time: 09/03/23 10:30 AM   Specimen: BLOOD RIGHT ARM  Result Value Ref Range Status   Specimen Description BLOOD RIGHT ARM  Final   Special Requests   Final    BOTTLES DRAWN AEROBIC AND ANAEROBIC Blood Culture results may not be optimal due to an excessive volume of blood received in culture bottles   Culture   Final    NO GROWTH < 24 HOURS Performed at [facility], [address], [place], [place] [address]    Report Status PENDING  Incomplete   Blood culture (routine x 2)     Status: None (Preliminary result)   Collection Time: 09/03/23 10:55 AM   Specimen: BLOOD LEFT ARM  Result Value Ref Range Status   Specimen Description BLOOD LEFT ARM  Final   Special Requests   Final    BOTTLES DRAWN AEROBIC AND ANAEROBIC Blood Culture results may not be optimal due to an excessive volume of blood received in culture bottles   Culture   Final    NO GROWTH < 24 HOURS Performed at [facility], [address] [place], [place] [address]    Report Status PENDING  Incomplete         Radiology Studies: CT Head Wo Contrast  Result Date: 09/02/2023 CLINICAL DATA:  Head headache, fever EXAM: CT HEAD WITHOUT CONTRAST TECHNIQUE: Contiguous axial images were obtained from the base of the skull through the vertex without intravenous contrast. RADIATION DOSE REDUCTION: This exam was performed according to the departmental dose-optimization program which includes automated exposure control, adjustment of the mA and/or kV according to patient size and/or use of iterative reconstruction technique. COMPARISON:  None Available. FINDINGS: Brain: No evidence of acute infarction, hemorrhage, hydrocephalus, extra-axial collection or mass lesion/mass effect. Vascular: No hyperdense vessel or unexpected calcification. Skull: Normal. Negative for fracture or focal lesion. Sinuses/Orbits: No acute finding. Other: Mastoid air cells and middle ear cavities are clear. IMPRESSION: 1. No acute intracranial abnormality. Electronically Signed   By: Helyn Numbers M.D.   On: 09/02/2023 16:52   CT ABDOMEN PELVIS W CONTRAST  Result Date: 09/02/2023 CLINICAL DATA:  Left lower quadrant abdominal pain EXAM: CT ABDOMEN AND PELVIS WITH CONTRAST TECHNIQUE: Multidetector CT imaging of the abdomen and pelvis was performed using the standard protocol following bolus administration of intravenous contrast. RADIATION DOSE REDUCTION: This exam was performed according to the  departmental dose-optimization program which includes automated exposure control, adjustment of the mA and/or kV according to patient size and/or use of iterative reconstruction technique. CONTRAST:  75mL OMNIPAQUE IOHEXOL 350 MG/ML SOLN COMPARISON:  None Available. FINDINGS: Lower chest: No acute abnormality. Hepatobiliary: Focal fatty hepatic  infiltration adjacent to the falciform ligament. No enhancing intrahepatic mass. No gallstones, gallbladder wall thickening, or biliary dilatation. Pancreas: Unremarkable Spleen: Unremarkable Adrenals/Urinary Tract: The adrenal glands are unremarkable. The kidneys are normal in size and position. There is asymmetric mild left perinephric and periureteric inflammatory stranding which is nonspecific, but suggests a unilateral inflammatory process, most commonly ascending urinary tract infection/pyelonephritis in a patient of this age. No hydronephrosis. No intrarenal or ureteral calculi. The right kidney is unremarkable. The bladder is decompressed and. There is, however, superimposed circumferential perivesicular inflammatory stranding, best appreciated on sagittal images, suggesting a diffuse infectious or inflammatory cystitis. Stomach/Bowel: The stomach, small bowel, and large bowel are unremarkable. Appendix normal. No free intraperitoneal gas or fluid. Vascular/Lymphatic: Mild aortoiliac atherosclerotic calcification. No aortic aneurysm. No pathologic adenopathy within the abdomen and pelvis. Reproductive: Uterus and bilateral adnexa are unremarkable. Other: Tiny bilateral fat containing inguinal hernias are present Musculoskeletal: No acute or significant osseous findings. IMPRESSION: Inflammatory changes in keeping with probable infectious cystitis and left-sided ascending urinary tract infection/pyelonephritis. Correlation with urinalysis and urine culture is recommended. No hydronephrosis. Electronically Signed   By: Helyn Numbers M.D.   On: 09/02/2023 16:49   DG  Chest 2 View  Result Date: 09/02/2023 CLINICAL DATA:  Chest pain. EXAM: CHEST - 2 VIEW COMPARISON:  12/23/2018. FINDINGS: Bilateral lung fields are clear. Bilateral costophrenic angles are clear. Normal cardio-mediastinal silhouette. No acute osseous abnormalities. The soft tissues are within normal limits. IMPRESSION: No active cardiopulmonary disease. Electronically Signed   By: Jules Schick M.D.   On: 09/02/2023 13:51        Scheduled Meds:  docusate sodium  100 mg Oral BID   enoxaparin (LOVENOX) injection  40 mg Subcutaneous Q24H   nicotine  14 mg Transdermal Daily   Continuous Infusions:  cefTRIAXone (ROCEPHIN)  IV       LOS: 1 day    Time spent: 35 minutes    Jaidah Lomax A Cheyann Blecha, MD [facility]   If 7PM-7AM, please contact night-coverage [email or web address]  09/04/2023, 7:29 AM

## 2023-09-04 NOTE — Plan of Care (Signed)
  Problem: Education: Goal: Knowledge of General Education information will improve Description: Including pain rating scale, medication(s)/side effects and non-pharmacologic comfort measures 09/04/2023 2038 by Tennis Ship, RN Outcome: Progressing 09/04/2023 2038 by Tennis Ship, RN Outcome: Progressing   Problem: Health Behavior/Discharge Planning: Goal: Ability to manage health-related needs will improve 09/04/2023 2038 by Tennis Ship, RN Outcome: Progressing 09/04/2023 2038 by Tennis Ship, RN Outcome: Progressing   Problem: Clinical Measurements: Goal: Ability to maintain clinical measurements within normal limits will improve 09/04/2023 2038 by Tennis Ship, RN Outcome: Progressing 09/04/2023 2038 by Tennis Ship, RN Outcome: Progressing Goal: Will remain free from infection 09/04/2023 2038 by Tennis Ship, RN Outcome: Progressing 09/04/2023 2038 by Tennis Ship, RN Outcome: Progressing Goal: Diagnostic test results will improve 09/04/2023 2038 by Tennis Ship, RN Outcome: Progressing 09/04/2023 2038 by Tennis Ship, RN Outcome: Progressing Goal: Respiratory complications will improve 09/04/2023 2038 by Tennis Ship, RN Outcome: Progressing 09/04/2023 2038 by Tennis Ship, RN Outcome: Progressing Goal: Cardiovascular complication will be avoided 09/04/2023 2038 by Tennis Ship, RN Outcome: Progressing 09/04/2023 2038 by Tennis Ship, RN Outcome: Progressing   Problem: Activity: Goal: Risk for activity intolerance will decrease 09/04/2023 2038 by Tennis Ship, RN Outcome: Progressing 09/04/2023 2038 by Tennis Ship, RN Outcome: Progressing   Problem: Nutrition: Goal: Adequate nutrition will be maintained 09/04/2023 2038 by Tennis Ship, RN Outcome: Progressing 09/04/2023 2038 by Tennis Ship, RN Outcome: Progressing   Problem: Coping: Goal: Level  of anxiety will decrease 09/04/2023 2038 by Tennis Ship, RN Outcome: Progressing 09/04/2023 2038 by Tennis Ship, RN Outcome: Progressing   Problem: Elimination: Goal: Will not experience complications related to bowel motility 09/04/2023 2038 by Tennis Ship, RN Outcome: Progressing 09/04/2023 2038 by Tennis Ship, RN Outcome: Progressing Goal: Will not experience complications related to urinary retention 09/04/2023 2038 by Tennis Ship, RN Outcome: Progressing 09/04/2023 2038 by Tennis Ship, RN Outcome: Progressing   Problem: Pain Managment: Goal: General experience of comfort will improve 09/04/2023 2038 by Tennis Ship, RN Outcome: Progressing 09/04/2023 2038 by Tennis Ship, RN Outcome: Progressing   Problem: Safety: Goal: Ability to remain free from injury will improve 09/04/2023 2038 by Tennis Ship, RN Outcome: Progressing 09/04/2023 2038 by Tennis Ship, RN Outcome: Progressing   Problem: Skin Integrity: Goal: Risk for impaired skin integrity will decrease 09/04/2023 2038 by Tennis Ship, RN Outcome: Progressing 09/04/2023 2038 by Tennis Ship, RN Outcome: Progressing

## 2023-09-05 ENCOUNTER — Other Ambulatory Visit (HOSPITAL_COMMUNITY): Payer: Self-pay

## 2023-09-05 ENCOUNTER — Telehealth (HOSPITAL_BASED_OUTPATIENT_CLINIC_OR_DEPARTMENT_OTHER): Payer: Self-pay

## 2023-09-05 DIAGNOSIS — R7881 Bacteremia: Secondary | ICD-10-CM | POA: Diagnosis not present

## 2023-09-05 DIAGNOSIS — B9620 Unspecified Escherichia coli [E. coli] as the cause of diseases classified elsewhere: Secondary | ICD-10-CM | POA: Diagnosis not present

## 2023-09-05 LAB — CULTURE, BLOOD (ROUTINE X 2)

## 2023-09-05 MED ORDER — CEFADROXIL 500 MG PO CAPS
1000.0000 mg | ORAL_CAPSULE | Freq: Two times a day (BID) | ORAL | 0 refills | Status: AC
  Filled 2023-09-05: qty 40, 10d supply, fill #0

## 2023-09-05 MED ORDER — FERROUS SULFATE 325 (65 FE) MG PO TABS
324.0000 mg | ORAL_TABLET | Freq: Every day | ORAL | 1 refills | Status: AC
  Filled 2023-09-05 – 2023-10-08 (×2): qty 30, 30d supply, fill #0

## 2023-09-05 MED ORDER — NICOTINE 14 MG/24HR TD PT24
14.0000 mg | MEDICATED_PATCH | Freq: Every day | TRANSDERMAL | 0 refills | Status: DC
  Filled 2023-09-05: qty 28, 28d supply, fill #0

## 2023-09-05 NOTE — Hospital Course (Signed)
47yo with h/o UTI with pyelonephritis and subsequent bacteremia.  She was admitted on 10/21 and started on Ceftriaxone.

## 2023-09-05 NOTE — Discharge Summary (Signed)
Physician Discharge Summary   Patient: Breanna Carlson MRN: [id number] DOB: [birth year]  Admit date:     09/03/2023  Discharge date: 09/05/23  Discharge Physician: Jonah Blue   PCP: Patient, No Pcp Per   Recommendations at discharge:   Complete antibiotics as directed - cefadroxil 1000 mg twice daily for 10 days F/u with PCP when appointment can be scheduled Stop smoking - nicotine patch provided Take iron supplement daily  Discharge Diagnoses: Principal Problem:   E coli bacteremia Active Problems:   Acute pyelonephritis   Tobacco dependence    Hospital Course: 47yo with h/o UTI with pyelonephritis and subsequent bacteremia.  She was admitted on 10/21 and started on Ceftriaxone.  Assessment and Plan:  E coli pyelonephritis with bacteremia -Patient presenting without sepsis physiology but with symptoms concerning for UTI with + imaging yesterday, now with bacteremia -Urine and blood cultures both + for E coli -Admitted to med surg -Continued treatment with Rocephin x 3 days -> PO Cefadroxil 1000 mg BID x 10 more days -Repeat blood cultures are negative x 2 days -She was unable to fill rx prior to being recalled to the ER -No current concern for additional complications   Tobacco dependence -Encourage cessation.   -This was discussed with the patient and should be reviewed on an ongoing basis.   -Patch ordered at patient request.    Iron deficiency anemia Needs iron supplement at discharge.  Needs outpatient evaluation/screening colonoscopy    Obesity Body mass index is 31.17 kg/m.Marland Kitchen  Weight loss should be encouraged Outpatient PCP/bariatric medicine f/u encouraged        Consultants: None   Procedures: None    Antibiotics: Ceftriaxone 10/20-23 Cefadroxil 10/24-30   30 Day Unplanned Readmission Risk Score    Flowsheet Row ED to Hosp-Admission (Current) from 09/03/2023 in [facility]  30 Day Unplanned Readmission Risk  Score (%) 6.95 Filed at 09/05/2023 1200       This score is the patient's risk of an unplanned readmission within 30 days of being discharged (0 -100%). The score is based on dignosis, age, lab data, medications, orders, and past utilization.   Low:  0-14.9   Medium: 15-21.9   High: 22-29.9   Extreme: 30 and above          Pain control - [place] Controlled Substance Reporting System database was reviewed. and patient was instructed, not to drive, operate heavy machinery, perform activities at heights, swimming or participation in water activities or provide baby-sitting services while on Pain, Sleep and Anxiety Medications; until their outpatient Physician has advised to do so again. Also recommended to not to take more than prescribed Pain, Sleep and Anxiety Medications.    Disposition: Home Diet recommendation:  Regular diet DISCHARGE MEDICATION: Allergies as of 09/05/2023   No Known Allergies      Medication List     STOP taking these medications    cephALEXin 500 MG capsule Commonly known as: KEFLEX       TAKE these medications    cefadroxil 500 MG capsule Commonly known as: DURICEF Take 2 capsules (1,000 mg total) by mouth 2 (two) times daily for 10 days.   ferrous sulfate 324 (65 Fe) MG Tbec Take 1 tablet (324 mg total) by mouth daily.   naproxen sodium 220 MG tablet Commonly known as: ALEVE Take 220 mg by mouth daily as needed.   nicotine 14 mg/24hr patch Commonly known as: NICODERM CQ - dosed in mg/24 hours Place  1 patch (14 mg total) onto the skin daily. Start taking on: September 06, 2023        Discharge Exam: Ceasar Mons Weights   09/03/23 1421  Weight: 87.6 kg     Subjective: Feeling much better, wants to go home today.   Objective: Vitals:   09/05/23 0505 09/05/23 0726  BP: 126/61 126/75  Pulse: 60 (!) 55  Resp: 18 17  Temp: 97.8 F (36.6 C) 98.1 F (36.7 C)  SpO2: 100% 100%    Intake/Output Summary (Last 24 hours) at  09/05/2023 1248 Last data filed at 09/05/2023 2952 Gross per 24 hour  Intake 240 ml  Output --  Net 240 ml   Filed Weights   09/03/23 1421  Weight: 87.6 kg    Exam:  General:  Appears calm and comfortable and is in NAD Eyes:   EOMI, normal lids, iris ENT:  grossly normal hearing, lips & tongue, mmm; appropriate dentition with partials Neck:  no LAD, masses or thyromegaly Cardiovascular:  RRR, no m/r/g. No LE edema.  Respiratory:   CTA bilaterally with no wheezes/rales/rhonchi.  Normal respiratory effort. Abdomen:  soft, NT, ND Back:   no CVAT Skin:  no rash or induration seen on limited exam Musculoskeletal:  grossly normal tone BUE/BLE, good ROM, no bony abnormality Psychiatric:  grossly normal mood and affect, speech fluent and appropriate, AOx3 Neurologic:  CN 2-12 grossly intact, moves all extremities in coordinated fashion  Data Reviewed: I have reviewed the patient's lab results since admission.  Pertinent labs for today include:   Urine and blood cultures - E coli resistant to Ampicillin, Augmentin, Bactrim  Repeat blood cultures NTD x 2 days    Condition at discharge: improving  The results of significant diagnostics from this hospitalization (including imaging, microbiology, ancillary and laboratory) are listed below for reference.   Imaging Studies: CT Head Wo Contrast  Result Date: 09/02/2023 CLINICAL DATA:  Head headache, fever EXAM: CT HEAD WITHOUT CONTRAST TECHNIQUE: Contiguous axial images were obtained from the base of the skull through the vertex without intravenous contrast. RADIATION DOSE REDUCTION: This exam was performed according to the departmental dose-optimization program which includes automated exposure control, adjustment of the mA and/or kV according to patient size and/or use of iterative reconstruction technique. COMPARISON:  None Available. FINDINGS: Brain: No evidence of acute infarction, hemorrhage, hydrocephalus, extra-axial collection or  mass lesion/mass effect. Vascular: No hyperdense vessel or unexpected calcification. Skull: Normal. Negative for fracture or focal lesion. Sinuses/Orbits: No acute finding. Other: Mastoid air cells and middle ear cavities are clear. IMPRESSION: 1. No acute intracranial abnormality. Electronically Signed   By: Helyn Numbers M.D.   On: 09/02/2023 16:52   CT ABDOMEN PELVIS W CONTRAST  Result Date: 09/02/2023 CLINICAL DATA:  Left lower quadrant abdominal pain EXAM: CT ABDOMEN AND PELVIS WITH CONTRAST TECHNIQUE: Multidetector CT imaging of the abdomen and pelvis was performed using the standard protocol following bolus administration of intravenous contrast. RADIATION DOSE REDUCTION: This exam was performed according to the departmental dose-optimization program which includes automated exposure control, adjustment of the mA and/or kV according to patient size and/or use of iterative reconstruction technique. CONTRAST:  75mL OMNIPAQUE IOHEXOL 350 MG/ML SOLN COMPARISON:  None Available. FINDINGS: Lower chest: No acute abnormality. Hepatobiliary: Focal fatty hepatic infiltration adjacent to the falciform ligament. No enhancing intrahepatic mass. No gallstones, gallbladder wall thickening, or biliary dilatation. Pancreas: Unremarkable Spleen: Unremarkable Adrenals/Urinary Tract: The adrenal glands are unremarkable. The kidneys are normal in size and position. There is  asymmetric mild left perinephric and periureteric inflammatory stranding which is nonspecific, but suggests a unilateral inflammatory process, most commonly ascending urinary tract infection/pyelonephritis in a patient of this age. No hydronephrosis. No intrarenal or ureteral calculi. The right kidney is unremarkable. The bladder is decompressed and. There is, however, superimposed circumferential perivesicular inflammatory stranding, best appreciated on sagittal images, suggesting a diffuse infectious or inflammatory cystitis. Stomach/Bowel: The  stomach, small bowel, and large bowel are unremarkable. Appendix normal. No free intraperitoneal gas or fluid. Vascular/Lymphatic: Mild aortoiliac atherosclerotic calcification. No aortic aneurysm. No pathologic adenopathy within the abdomen and pelvis. Reproductive: Uterus and bilateral adnexa are unremarkable. Other: Tiny bilateral fat containing inguinal hernias are present Musculoskeletal: No acute or significant osseous findings. IMPRESSION: Inflammatory changes in keeping with probable infectious cystitis and left-sided ascending urinary tract infection/pyelonephritis. Correlation with urinalysis and urine culture is recommended. No hydronephrosis. Electronically Signed   By: Helyn Numbers M.D.   On: 09/02/2023 16:49   DG Chest 2 View  Result Date: 09/02/2023 CLINICAL DATA:  Chest pain. EXAM: CHEST - 2 VIEW COMPARISON:  12/23/2018. FINDINGS: Bilateral lung fields are clear. Bilateral costophrenic angles are clear. Normal cardio-mediastinal silhouette. No acute osseous abnormalities. The soft tissues are within normal limits. IMPRESSION: No active cardiopulmonary disease. Electronically Signed   By: Jules Schick M.D.   On: 09/02/2023 13:51    Microbiology: Results for orders placed or performed during the hospital encounter of 09/03/23  Blood culture (routine x 2)     Status: None (Preliminary result)   Collection Time: 09/03/23 10:30 AM   Specimen: BLOOD RIGHT ARM  Result Value Ref Range Status   Specimen Description BLOOD RIGHT ARM  Final   Special Requests   Final    BOTTLES DRAWN AEROBIC AND ANAEROBIC Blood Culture results may not be optimal due to an excessive volume of blood received in culture bottles   Culture   Final    NO GROWTH 2 DAYS Performed at [facility], [address], [place], [place] [address]    Report Status PENDING  Incomplete  Blood culture (routine x 2)     Status: None (Preliminary result)   Collection Time: 09/03/23 10:55 AM   Specimen: BLOOD LEFT ARM   Result Value Ref Range Status   Specimen Description BLOOD LEFT ARM  Final   Special Requests   Final    BOTTLES DRAWN AEROBIC AND ANAEROBIC Blood Culture results may not be optimal due to an excessive volume of blood received in culture bottles   Culture   Final    NO GROWTH 2 DAYS Performed at [facility], [address] [place], [place] [address]    Report Status PENDING  Incomplete    Labs: CBC: Recent Labs  Lab 09/02/23 1026 09/03/23 1003 09/04/23 0620  WBC 6.0 7.0 6.3  NEUTROABS  --  5.1  --   HGB 9.7* 10.1* 9.4*  HCT 31.9* 32.9* 29.8*  MCV 81.4 81.6 81.9  PLT 355 377 377   Basic Metabolic Panel: Recent Labs  Lab 09/02/23 1026 09/03/23 1003 09/04/23 0620  NA 138 139 136  K 3.5 3.6 3.5  CL 104 105 101  CO2 23 26 25   GLUCOSE 89 102* 92  BUN 5* <5* 7  CREATININE 0.61 0.73 0.63  CALCIUM 9.5 9.5 9.3   Liver Function Tests: Recent Labs  Lab 09/02/23 1253 09/03/23 1003  AST 17 19  ALT 16 15  ALKPHOS 87 101  BILITOT 0.3 0.5  PROT  7.1 7.6  ALBUMIN 3.2* 3.4*   CBG: No results for input(s): "GLUCAP" in the last 168 hours.  Discharge time spent: greater than 30 minutes.  Signed: Jonah Blue, MD [facility] 09/05/2023

## 2023-09-06 ENCOUNTER — Telehealth (HOSPITAL_BASED_OUTPATIENT_CLINIC_OR_DEPARTMENT_OTHER): Payer: Self-pay

## 2023-09-06 NOTE — Telephone Encounter (Signed)
Post ED Visit - Positive Culture Follow-up  Culture report reviewed by antimicrobial stewardship pharmacist: [facility] [x]  [place], [place].D. []  Celedonio Miyamoto, Pharm.D., BCPS AQ-ID []  Garvin Fila, Pharm.D., BCPS []  Georgina Pillion, [address].D., BCPS []  Royal Kunia, [address].D., BCPS, AAHIVP []  Estella Husk, Pharm.D., BCPS, AAHIVP []  Lysle Pearl, PharmD, BCPS []  Phillips Climes, PharmD, BCPS []  Agapito Games, PharmD, BCPS []  Verlan Friends, PharmD []  Mervyn Gay, PharmD, BCPS []  Vinnie Level, PharmD  [facility] []  Len Childs, PharmD []  Greer Pickerel, PharmD []  Adalberto Cole, PharmD []  Perlie Gold, Rph []  Lonell Face) Jean Rosenthal, PharmD []  Earl Many, PharmD []  Junita Push, PharmD []  Dorna Leitz, PharmD []  Terrilee Files, PharmD []  Lynann Beaver, PharmD []  Keturah Barre, PharmD []  Loralee Pacas, PharmD []  Bernadene Person, PharmD   Positive blood culture Treated with Cephalexin, organism sensitive to the same and no further patient follow-up is required at this time.  Sandria Senter 09/06/2023, 9:50 AM

## 2023-09-07 ENCOUNTER — Telehealth (INDEPENDENT_AMBULATORY_CARE_PROVIDER_SITE_OTHER): Payer: Self-pay | Admitting: Primary Care

## 2023-09-07 LAB — CULTURE, BLOOD (ROUTINE X 2): Culture: NO GROWTH

## 2023-09-07 NOTE — Telephone Encounter (Signed)
Returned pt call and made aware that she will need to wait till her appt with Marcelino Duster to discuss having a stool test done. Pt states she understands and doesn't have any questions or concerns

## 2023-09-07 NOTE — Telephone Encounter (Signed)
Patient is calling to inquire if the infection she was hospitalized for could have been linked to something she ate. Patient wants stool test to check for source of infection. Patient was recently hospitalized and is presently being treated with antibiotics. She has contacted the health dept and they have recommended stool testing. Patient advised the testing may be too late at office but certainly will send message to provider as informational call.

## 2023-09-08 LAB — CULTURE, BLOOD (ROUTINE X 2)
Culture: NO GROWTH
Culture: NO GROWTH

## 2023-09-19 ENCOUNTER — Telehealth (INDEPENDENT_AMBULATORY_CARE_PROVIDER_SITE_OTHER): Payer: Self-pay | Admitting: Primary Care

## 2023-09-19 NOTE — Telephone Encounter (Signed)
Called to confirm apt on 11/7. Pt did not answer, VM was left.

## 2023-09-20 ENCOUNTER — Ambulatory Visit (INDEPENDENT_AMBULATORY_CARE_PROVIDER_SITE_OTHER): Payer: 59 | Admitting: Primary Care

## 2023-09-20 ENCOUNTER — Encounter (INDEPENDENT_AMBULATORY_CARE_PROVIDER_SITE_OTHER): Payer: Self-pay | Admitting: Primary Care

## 2023-09-20 VITALS — BP 122/81 | HR 77 | Resp 16 | Ht 66.0 in | Wt 189.0 lb

## 2023-09-20 DIAGNOSIS — Z7689 Persons encountering health services in other specified circumstances: Secondary | ICD-10-CM | POA: Diagnosis not present

## 2023-09-20 DIAGNOSIS — Z09 Encounter for follow-up examination after completed treatment for conditions other than malignant neoplasm: Secondary | ICD-10-CM

## 2023-09-20 DIAGNOSIS — E66811 Obesity, class 1: Secondary | ICD-10-CM

## 2023-09-20 DIAGNOSIS — Z6830 Body mass index (BMI) 30.0-30.9, adult: Secondary | ICD-10-CM | POA: Diagnosis not present

## 2023-09-20 DIAGNOSIS — Z28310 Unvaccinated for covid-19: Secondary | ICD-10-CM

## 2023-09-20 DIAGNOSIS — E6609 Other obesity due to excess calories: Secondary | ICD-10-CM | POA: Diagnosis not present

## 2023-09-20 DIAGNOSIS — Z1211 Encounter for screening for malignant neoplasm of colon: Secondary | ICD-10-CM

## 2023-09-20 DIAGNOSIS — Z2821 Immunization not carried out because of patient refusal: Secondary | ICD-10-CM

## 2023-09-20 NOTE — Progress Notes (Signed)
  [facility]   Subjective:   Breanna Carlson is a 47 y.o. female presents for hospital follow up and establish care.Presented to ED on 09/02/23   with chest pain, UTI symptoms, left lower abdominal pain. discharge her on Keflex.  Blood cultures have already been drawn to be conservative, urine cx as well.  She returned to the ED admit to the hospital was 09/03/23, patient was discharged from the hospital on 09/05/23, patient was admitted for: UTI with pyelonephritis and subsequent bacteremia.  Iron deficiency anemia Obesity and Tobacco dependence. Today she is feeling better , energy improving , stress . Patient has No headache, No chest pain, No abdominal pain - No Nausea, No new weakness tingling or numbness, No Cough - shortness of breath  No past medical history on file.   No Known Allergies    Current Outpatient Medications on File Prior to Visit  Medication Sig Dispense Refill   ferrous sulfate 325 (65 FE) MG tablet Take 1 tablet by mouth daily. 30 tablet 1   naproxen sodium (ALEVE) 220 MG tablet Take 220 mg by mouth daily as needed.     nicotine (NICODERM CQ - DOSED IN MG/24 HOURS) 14 mg/24hr patch Place 1 patch (14 mg total) onto the skin daily. 28 patch 0   No current facility-administered medications on file prior to visit.   Review of System: Comprehensive ROS Pertinent positive and negative noted in HPI    Objective:  BP 122/81   Pulse 77   Resp 16   Ht 5\' 6"  (1.676 m)   Wt 189 lb (85.7 kg)   LMP 09/20/2023   SpO2 98%   BMI 30.51 kg/m   Filed Weights   09/20/23 1217  Weight: 189 lb (85.7 kg)    Physical Exam: General Appearance: Well nourished, in no apparent distress. Eyes: PERRLA, EOMs, conjunctiva no swelling or erythema Sinuses: No Frontal/maxillary tenderness ENT/Mouth: Ext aud canals clear, TMs without erythema, bulging. No erythema, swelling, or exudate on post pharynx.  Tonsils not swollen or erythematous. Hearing normal.  Neck:  Supple, thyroid normal.  Respiratory: Respiratory effort normal, BS equal bilaterally without rales, rhonchi, wheezing or stridor.  Cardio: RRR with no MRGs. Brisk peripheral pulses without edema.  Abdomen: Soft, + BS.  Non tender, no guarding, rebound, hernias, masses. Lymphatics: Non tender without lymphadenopathy.  Musculoskeletal: Full ROM, 5/5 strength, normal gait.  Skin: Warm, dry without rashes, lesions, ecchymosis.  Neuro: Cranial nerves intact. Normal muscle tone, no cerebellar symptoms. Sensation intact.  Psych: Awake and oriented X 3, normal affect, Insight and Judgment appropriate.    Assessment:  Deni was seen today for hospitalization follow-up.  Diagnoses and all orders for this visit:  Encounter to establish care  Influenza vaccination declined  Tetanus, diphtheria, and acellular pertussis (Tdap) vaccination declined  COVID-19 vaccine series declined  Hospital discharge follow-up Completed   Colon cancer screening -     Ambulatory referral to Gastroenterology  Class 1 obesity due to excess calories with serious comorbidity and body mass index (BMI) of 30.0 to 30.9 in adult  Obesity is 30-39 indicating an excess in caloric intake or underlining conditions. This may lead to other co-morbidities. Educated on lifestyle modifications of diet and exercise which may reduce obesity.      This note has been created with Education officer, environmental. Any transcriptional errors are unintentional.   Grayce Sessions, NP 09/20/2023, 12:21 PM

## 2023-09-20 NOTE — Patient Instructions (Signed)
Exercising to Lose Weight Getting regular exercise is important for everyone. It is especially important if you are overweight. Being overweight increases your risk of heart disease, stroke, diabetes, high blood pressure, and several types of cancer. Exercising, and reducing the calories you consume, can help you lose weight and improve fitness and health. Exercise can be moderate or vigorous intensity. To lose weight, most people need to do a certain amount of moderate or vigorous-intensity exercise each week. How can exercise affect me? You lose weight when you exercise enough to burn more calories than you eat. Exercise also reduces body fat and builds muscle. The more muscle you have, the more calories you burn. Exercise also: Improves mood. Reduces stress and tension. Improves your overall fitness, flexibility, and endurance. Increases bone strength. Moderate-intensity exercise  Moderate-intensity exercise is any activity that gets you moving enough to burn at least three times more energy (calories) than if you were sitting. Examples of moderate exercise include: Walking a mile in 15 minutes. Doing light yard work. Biking at an easy pace. Most people should get at least 150 minutes of moderate-intensity exercise a week to maintain their body weight. Vigorous-intensity exercise Vigorous-intensity exercise is any activity that gets you moving enough to burn at least six times more calories than if you were sitting. When you exercise at this intensity, you should be working hard enough that you are not able to carry on a conversation. Examples of vigorous exercise include: Running. Playing a team sport, such as football, basketball, and soccer. Jumping rope. Most people should get at least 75 minutes a week of vigorous exercise to maintain their body weight. What actions can I take to lose weight? The amount of exercise you need to lose weight depends on: Your age. The type of  exercise. Any health conditions you have. Your overall physical ability. Talk to your health care provider about how much exercise you need and what types of activities are safe for you. Nutrition  Make changes to your diet as told by your health care provider or diet and nutrition specialist (dietitian). This may include: Eating fewer calories. Eating more protein. Eating less unhealthy fats. Eating a diet that includes fresh fruits and vegetables, whole grains, low-fat dairy products, and lean protein. Avoiding foods with added fat, salt, and sugar. Drink plenty of water while you exercise to prevent dehydration or heat stroke. Activity Choose an activity that you enjoy and set realistic goals. Your health care provider can help you make an exercise plan that works for you. Exercise at a moderate or vigorous intensity most days of the week. The intensity of exercise may vary from person to person. You can tell how intense a workout is for you by paying attention to your breathing and heartbeat. Most people will notice their breathing and heartbeat get faster with more intense exercise. Do resistance training twice each week, such as: Push-ups. Sit-ups. Lifting weights. Using resistance bands. Getting short amounts of exercise can be just as helpful as long, structured periods of exercise. If you have trouble finding time to exercise, try doing these things as part of your daily routine: Get up, stretch, and walk around every 30 minutes throughout the day. Go for a walk during your lunch break. Park your car farther away from your destination. If you take public transportation, get off one stop early and walk the rest of the way. Make phone calls while standing up and walking around. Take the stairs instead of elevators or escalators.  Wear comfortable clothes and shoes with good support. Do not exercise so much that you hurt yourself, feel dizzy, or get very short of breath. Where to  find more information [place] Department of Health and Human Services: ThisPath.fi Centers for Disease Control and Prevention: FootballExhibition.com.br Contact a health care provider: Before starting a new exercise program. If you have questions or concerns about your weight. If you have a medical problem that keeps you from exercising. Get help right away if: You have any of the following while exercising: Injury. Dizziness. Difficulty breathing or shortness of breath that does not go away when you stop exercising. Chest pain. Rapid heartbeat. These symptoms may represent a serious problem that is an emergency. Do not wait to see if the symptoms will go away. Get medical help right away. Call your local emergency services (911 in the [place] Do not drive yourself to the hospital. Summary Getting regular exercise is especially important if you are overweight. Being overweight increases your risk of heart disease, stroke, diabetes, high blood pressure, and several types of cancer. Losing weight happens when you burn more calories than you eat. Reducing the amount of calories you eat, and getting regular moderate or vigorous exercise each week, helps you lose weight. This information is not intended to replace advice given to you by your health care provider. Make sure you discuss any questions you have with your health care provider. Document Revised: 12/26/2020 Document Reviewed: 12/26/2020 Elsevier Patient Education  2024 [employer] Inc. E. Coli Infection E. coli (Escherichia coli) are bacteria that can cause an infection in different parts of your body, including your intestines. E. coli bacteria normally live in the intestines of people and animals. Most types of E. coli do not cause infections, but some produce a poison (toxin) that can cause diarrhea. Depending on the toxin, this can cause mild or severe diarrhea. This condition is contagious. This means that it can spread from person to person. It can  also spread from animals to humans. Most cases of E. coli infection come from cows (cattle). In some cases, this infection can cause a dangerous complication called hemolytic uremic syndrome (HUS). HUS leads to blood cell abnormalities and kidney failure. What are the causes? This condition is caused by E. coli bacteria. You may get this bacteria by: Eating raw or undercooked beef. Touching an infected animal and then touching your mouth. Eating raw fruits or vegetables that have come into contact with the stool (feces) of infected animals. Drinking fluids that have been contaminated with E. coli from infected animals. Coming into contact with a surface that has been contaminated by an infected person. What increases the risk? This condition is more likely to develop in people who: Are young children or older adults. Eat raw or undercooked beef. Drink raw (unpasteurized) milk, cider, or juice. Eat cheeses made from unpasteurized milk. Eat raw vegetables such as spinach or lettuce. Are in close contact with cattle, goats, or sheep. Have a weak body defense system (immune system). What are the signs or symptoms? Symptoms of this condition usually start 3-4 days after the bacteria were swallowed (ingested). Symptoms include: Severe cramps and tenderness in the abdomen. Diarrhea. This may be watery or bloody. Nausea and vomiting. Dehydration. This can cause fatigue, thirst, a dry mouth, and less frequent urination. Low fever. This is not common. How is this diagnosed? This condition may be diagnosed based on: A medical history. A physical exam. A stool culture. This involves testing a sample of  your stool for E. coli or toxins of E. coli. How is this treated? Treatment for this condition includes rest and fluids (supportive care). If you have severe diarrhea, you may need to receive fluids through an IV. Symptoms of E. coli intestinal infection usually go away in 5-10 days. Some strains  of E. coli may be treated with antibiotic or antidiarrheal medicines. However, these medicines are rarely given because they increase your risk for HUS. Follow these instructions at home: Eating and drinking     Drink enough fluid to keep your urine pale yellow. You may need to drink small amounts of clear liquids frequently. Take an oral rehydration solution (ORS) as told by your health care provider. This drink is sold at pharmacies and retail stores. Drink clear fluids, such as water, ice chips, diluted fruit juice, and low-calorie sports drinks. Eat bland, easy-to-digest foods in small amounts as you are able. These foods include bananas, applesauce, rice, lean meats, toast, and crackers. Eat small, frequent meals rather than large meals. Do not drink milk, caffeine, or alcohol. Food safety Do not eat: Raw or undercooked beef. Cheese that was made with unpasteurized milk. Do not drink: Unpasteurized milk. Unpasteurized apple cider. Wash cutting boards, counters, and utensils with hot, soapy water after you prepare raw meat. Wash all fruits and vegetables before you eat or cook them. General instructions  Take over-the-counter and prescription medicines only as told by your health care provider. Wash your hands thoroughly with soap and water for at least 20 seconds: Before and after you prepare food. After you use the bathroom. Before you eat. After touching animals, especially cattle. After caring for an ill person. Make sure people who live with you also wash their hands often. If soap and water are not available, use alcohol-based hand sanitizer. Clean surfaces that you touch with a product that contains chlorine bleach. Keep all follow-up visits. This is important. Contact a health care provider if: Your symptoms do not get better or get worse. You have new symptoms. Get help right away if you: Have increasing pain or tenderness in your abdomen. Have ongoing (persistent)  vomiting or diarrhea. Have abdominal pain that stays in one area (localizes). Have diarrhea with more blood in it. Have a fever. Cannot eat or drink without vomiting. Have signs of dehydration or HUS, such as: Pale skin. Dark urine, very little urine, or no urine. Cracked lips. Not making tears while crying. Dry mouth. Sunken eyes. Sleepiness. Weakness. Dizziness. Summary E. coli are bacteria that can cause an infection in different parts of your body, including your intestines. Most types of E. coli do not cause infections, but some produce a toxin that can cause diarrhea. Treatment for this condition includes rest and fluids. If you have severe diarrhea, you may need to receive fluids through an IV. Symptoms of E. coli intestinal infection usually go away in 5-10 days. Follow your health care provider's instructions about medicines, eating and drinking, food safety and general hygiene, and when to call for help. This information is not intended to replace advice given to you by your health care provider. Make sure you discuss any questions you have with your health care provider. Document Revised: 05/13/2021 Document Reviewed: 05/13/2021 Elsevier Patient Education  2024 [employer].

## 2023-09-27 ENCOUNTER — Ambulatory Visit (INDEPENDENT_AMBULATORY_CARE_PROVIDER_SITE_OTHER): Payer: Self-pay | Admitting: *Deleted

## 2023-09-27 NOTE — Telephone Encounter (Signed)
FYI

## 2023-09-27 NOTE — Telephone Encounter (Signed)
Reason for Disposition  MILD or MODERATE vomiting (e.g., 1 - 5 times / day)  Answer Assessment - Initial Assessment Questions 1. VOMITING SEVERITY: "How many times have you vomited in the past 24 hours?"     - MILD:  1 - 2 times/day    - MODERATE: 3 - 5 times/day, decreased oral intake without significant weight loss or symptoms of dehydration    - SEVERE: 6 or more times/day, vomits everything or nearly everything, with significant weight loss, symptoms of dehydration      Mild- twice 2. ONSET: "When did the vomiting begin?"      Yesterday- nausea- this morning started 3. FLUIDS: "What fluids or food have you vomited up today?" "Have you been able to keep any fluids down?"     Water- stayed down 4. ABDOMEN PAIN: "Are your having any abdomen pain?" If Yes : "How bad is it and what does it feel like?" (e.g., crampy, dull, intermittent, constant)      no 5. DIARRHEA: "Is there any diarrhea?" If Yes, ask: "How many times today?"      no 6. CONTACTS: "Is there anyone else in the family with the same symptoms?"      no 7. CAUSE: "What do you think is causing your vomiting?"     No- recently released from hospital- took antibiotics 10 days- increased stress- was feeling better 8. HYDRATION STATUS: "Any signs of dehydration?" (e.g., dry mouth [not only dry lips], too weak to stand) "When did you last urinate?"     no 9. OTHER SYMPTOMS: "Do you have any other symptoms?" (e.g., fever, headache, vertigo, vomiting blood or coffee grounds, recent head injury)     no  Protocols used: Vomiting-A-AH

## 2023-09-27 NOTE — Telephone Encounter (Signed)
  Chief Complaint: vomiting- mild Symptoms: vomited twice today- is able to keep fluids down Frequency: started today Pertinent Negatives: Patient denies fever, headache, vertigo, recent head injury Disposition: [] ED /[] Urgent Care (no appt availability in office) / [] Appointment(In office/virtual)/ []  [facility]/ [x] Home Care/ [] Refused Recommended Disposition /[] [facility]/ []  Follow-up with PCP Additional Notes: Patient advised home care and will continue to monitor symptoms- she will call back if her symptoms should get worse or increase.

## 2023-10-01 ENCOUNTER — Ambulatory Visit (INDEPENDENT_AMBULATORY_CARE_PROVIDER_SITE_OTHER): Payer: 59

## 2023-10-05 ENCOUNTER — Ambulatory Visit (INDEPENDENT_AMBULATORY_CARE_PROVIDER_SITE_OTHER): Payer: 59

## 2023-10-08 ENCOUNTER — Other Ambulatory Visit (HOSPITAL_COMMUNITY): Payer: Self-pay

## 2023-10-09 ENCOUNTER — Other Ambulatory Visit: Payer: Self-pay

## 2023-10-15 ENCOUNTER — Telehealth (INDEPENDENT_AMBULATORY_CARE_PROVIDER_SITE_OTHER): Payer: Self-pay | Admitting: Primary Care

## 2023-10-15 NOTE — Telephone Encounter (Signed)
 Called to confirm apt. VM left.

## 2023-10-15 NOTE — Telephone Encounter (Signed)
Pt called back. Made pt aware of the appointment.

## 2023-10-15 NOTE — Telephone Encounter (Signed)
 Called to remind pt about apt. VM left

## 2023-10-16 ENCOUNTER — Ambulatory Visit (INDEPENDENT_AMBULATORY_CARE_PROVIDER_SITE_OTHER): Payer: 59 | Admitting: Primary Care

## 2023-11-01 ENCOUNTER — Encounter (INDEPENDENT_AMBULATORY_CARE_PROVIDER_SITE_OTHER): Payer: Self-pay | Admitting: Primary Care

## 2023-11-01 ENCOUNTER — Encounter: Payer: Self-pay | Admitting: Gastroenterology

## 2023-11-01 ENCOUNTER — Other Ambulatory Visit (HOSPITAL_COMMUNITY)
Admission: RE | Admit: 2023-11-01 | Discharge: 2023-11-01 | Disposition: A | Discharge status: Home / Self Care | Payer: 59 | Source: Ambulatory Visit | Attending: Primary Care | Admitting: Primary Care

## 2023-11-01 ENCOUNTER — Ambulatory Visit (INDEPENDENT_AMBULATORY_CARE_PROVIDER_SITE_OTHER): Payer: 59 | Admitting: Primary Care

## 2023-11-01 VITALS — BP 123/83 | HR 84 | Resp 16 | Wt 197.8 lb

## 2023-11-01 DIAGNOSIS — Z113 Encounter for screening for infections with a predominantly sexual mode of transmission: Secondary | ICD-10-CM | POA: Insufficient documentation

## 2023-11-01 DIAGNOSIS — Z124 Encounter for screening for malignant neoplasm of cervix: Secondary | ICD-10-CM | POA: Diagnosis not present

## 2023-11-01 DIAGNOSIS — Z1211 Encounter for screening for malignant neoplasm of colon: Secondary | ICD-10-CM

## 2023-11-01 NOTE — Progress Notes (Signed)
  [facility]  WELL-WOMAN PHYSICAL & PAP Patient name: Breanna Carlson MRN [id number]  Date of birth: [birth year] Chief Complaint:   Gynecologic Exam  History of Present Illness:   Breanna Carlson is a 47 y.o. No obstetric history on file. female being seen today for a routine well-woman exam.  gyn  The current method of family planning is none.  Patient's last menstrual period was 10/18/2023. Last pap never   Health Maintenance  Topic Date Due   Hepatitis C Screening  Never done   Pap with HPV screening  Never done   Colon Cancer Screening  Never done   COVID-19 Vaccine (1 - 2024-25 season) Never done   Flu Shot  02/11/2024*   DTaP/Tdap/Td vaccine (1 - Tdap) 09/19/2024*   HIV Screening  Completed   HPV Vaccine  Aged Out  *Topic was postponed. The date shown is not the original due date.   Review of Systems:    Denies any headaches, blurred vision, fatigue, shortness of breath, chest pain, abdominal pain, abnormal vaginal discharge/itching/odor/irritation, problems with periods, bowel movements, urination, or intercourse unless otherwise stated above.  Pertinent History Reviewed:   Reviewed past medical,surgical, social and family history.  Reviewed problem list, medications and allergies.  Physical Assessment:   Vitals:   11/01/23 1517  BP: 123/83  Pulse: 84  Resp: 16  SpO2: 99%  Weight: 197 lb 12.8 oz (89.7 kg)  Body mass index is 31.93 kg/m.        Physical Examination:  General appearance - well appearing, and in no distress Mental status - alert, oriented to person, place, and time Psych:  She has a normal mood and affect Skin - warm and dry, normal color, no suspicious lesions noted Chest - effort normal, all lung fields clear to auscultation bilaterally Heart - normal rate and regular rhythm Neck:  midline trachea, no thyromegaly or nodules Breasts - breasts appear normal, no suspicious masses, no skin or nipple changes or  axillary nodes Educated patient on proper self breast examination and had patient to demonstrate SBE. Abdomen - soft, nontender, nondistended, no masses or organomegaly Pelvic-VULVA: normal appearing vulva with no masses, tenderness or lesions   VAGINA: normal appearing vagina with normal color and discharge, no lesions   CERVIX: normal appearing cervix without discharge or lesions, no CMT UTERUS: uterus is felt to be normal size, shape, consistency and nontender  ADNEXA: No adnexal masses or tenderness noted. Extremities:  No swelling or varicosities noted  No results found for this or any previous visit (from the past 24 hours).   Assessment & Plan:  Breanna Carlson was seen today for gynecologic exam.  Diagnoses and all orders for this visit:  Cervical cancer screening -     Cytology - PAP  Screening for STD (sexually transmitted disease) -     Cervicovaginal ancillary only  Colon cancer screening -     Ambulatory referral to Gastroenterology     Follow-up: No follow-ups on file.  This note has been created with Education officer, environmental. Any transcriptional errors are unintentional.   Grayce Sessions, NP 11/04/2023, 3:27 PM

## 2023-11-06 LAB — CYTOLOGY - PAP
Comment: NEGATIVE
Diagnosis: NEGATIVE
Diagnosis: REACTIVE
High risk HPV: NEGATIVE

## 2023-11-06 LAB — CERVICOVAGINAL ANCILLARY ONLY
Bacterial Vaginitis (gardnerella): NEGATIVE
Candida Glabrata: NEGATIVE
Candida Vaginitis: NEGATIVE
Chlamydia: NEGATIVE
Comment: NEGATIVE
Comment: NEGATIVE
Comment: NEGATIVE
Comment: NEGATIVE
Comment: NEGATIVE
Comment: NORMAL
Neisseria Gonorrhea: NEGATIVE
Trichomonas: NEGATIVE

## 2023-11-21 ENCOUNTER — Telehealth: Payer: Self-pay | Admitting: *Deleted

## 2023-11-21 NOTE — Telephone Encounter (Signed)
 Pt contacted to reschedule colonoscopy d/t MD conflict. Pt rescheduled to 12/07/23 at 3p. Awaiting new appointment to be made for Pre-visit to review instructions.

## 2023-11-23 NOTE — Telephone Encounter (Signed)
 Attempted to reach patient to complete her PV appt via phone; no answer, left message for patient to call back to the office;  will attempt to reach patient at a later time;

## 2023-12-07 ENCOUNTER — Encounter: Payer: 59 | Admitting: Gastroenterology

## 2023-12-10 ENCOUNTER — Encounter: Payer: 59 | Admitting: Gastroenterology

## 2023-12-17 ENCOUNTER — Ambulatory Visit (INDEPENDENT_AMBULATORY_CARE_PROVIDER_SITE_OTHER): Payer: 59 | Admitting: Primary Care

## 2023-12-21 ENCOUNTER — Ambulatory Visit (AMBULATORY_SURGERY_CENTER): Payer: 59 | Admitting: *Deleted

## 2023-12-21 VITALS — Ht 66.0 in | Wt 190.0 lb

## 2023-12-21 DIAGNOSIS — Z1211 Encounter for screening for malignant neoplasm of colon: Secondary | ICD-10-CM

## 2023-12-21 MED ORDER — SUFLAVE 178.7 G PO SOLR: 1.0000 | Freq: Once | ORAL | 0 refills | Status: AC

## 2023-12-21 NOTE — Progress Notes (Signed)
 Pt's name and DOB verified at the beginning of the pre-visit wit 2 identifiers  Pt denies any difficulty with ambulating,sitting, laying down or rolling side to side  Pt has no issues with ambulation   Pt has no issues moving head neck or swallowing  No egg or soy allergy known to patient   Patient denies ever being intubated  No FH of Malignant Hyperthermia  Pt is not on diet pills or shots  Pt is not on home 02   Pt is not on blood thinners   Pt denies issues with constipation   Pt is not on dialysis  Pt denise any abnormal heart rhythms   Pt denies any upcoming cardiac testing  Pt encouraged to use to use Singlecare or Goodrx to reduce cost   Patient's chart reviewed by Norleen Schillings CNRA prior to pre-visit and patient appropriate for the [facility].  Pre-visit completed and red dot placed by patient's name on their procedure day (on provider's schedule).  .  Visit by phone  Pt states weight is 190 lb  Instructed pt why it is important to and  to call if they have any changes in health or new medications. Directed them to the # given and on instructions.     Instructions reviewed. Pt given [facility] and [facility] main # and MD on call # prior to instructions.  Pt states understanding. Instructed to review again prior to procedure. Pt states they will.   Informed pt that they will receive a  text call from [employer] [employer] regarding there prep med.;b

## 2024-01-04 ENCOUNTER — Telehealth: Payer: Self-pay | Admitting: *Deleted

## 2024-01-04 ENCOUNTER — Encounter: Payer: 59 | Admitting: Gastroenterology

## 2024-01-04 ENCOUNTER — Telehealth: Payer: Self-pay

## 2024-01-04 NOTE — Telephone Encounter (Signed)
Right, I do not see anything either. I will forward to pre-visit. It appears she was a direct.

## 2024-01-04 NOTE — Telephone Encounter (Signed)
Beth this was a previsit patient and I do not see where she has called. Is this taken care of?

## 2024-01-04 NOTE — Telephone Encounter (Signed)
LM and informed pt that she will need to call and reschedule her colonoscopy. # given

## 2024-01-04 NOTE — Telephone Encounter (Signed)
Pt states she will call and reschedule a colonoscopy when she is ready. Verified that she hwws the # to do so and instructed her to call # on back of insurance card to help her find out what her insurance will pay for the bowel prep medicatio. No other questions at this time.

## 2024-01-04 NOTE — Telephone Encounter (Signed)
Called pt to see if she would be coming in for her scheduled colonoscopy for today.  Pt stated that she had called the office several times last week to inform staff that she could not afford the co-pay to purchase the prep.  Pt stated that staff never returned her call.  Pt did not prep therefore will need to cancel procedure for today.  Pt wants to reschedule and would like for nurse to call in a least expensive prep.

## 2024-01-22 ENCOUNTER — Telehealth (INDEPENDENT_AMBULATORY_CARE_PROVIDER_SITE_OTHER): Payer: Self-pay | Admitting: Primary Care

## 2024-01-22 NOTE — Telephone Encounter (Signed)
 Spoke to pt about appt.. Will be present

## 2024-01-29 ENCOUNTER — Telehealth (INDEPENDENT_AMBULATORY_CARE_PROVIDER_SITE_OTHER): Payer: Self-pay | Admitting: Primary Care

## 2024-01-29 NOTE — Telephone Encounter (Signed)
 Pt stated that she may be able to call us back and see if she can make appt.

## 2024-01-30 ENCOUNTER — Ambulatory Visit (INDEPENDENT_AMBULATORY_CARE_PROVIDER_SITE_OTHER): Payer: 59 | Admitting: Primary Care

## 2024-01-30 ENCOUNTER — Telehealth (INDEPENDENT_AMBULATORY_CARE_PROVIDER_SITE_OTHER): Payer: Self-pay | Admitting: Family Medicine

## 2024-01-30 NOTE — Telephone Encounter (Signed)
 Called pt to reschedule appt. Pt was rescheduled.

## 2024-02-01 ENCOUNTER — Ambulatory Visit (INDEPENDENT_AMBULATORY_CARE_PROVIDER_SITE_OTHER): Payer: 59 | Admitting: Primary Care

## 2024-02-13 ENCOUNTER — Ambulatory Visit (INDEPENDENT_AMBULATORY_CARE_PROVIDER_SITE_OTHER): Admitting: Primary Care

## 2024-02-20 ENCOUNTER — Telehealth (INDEPENDENT_AMBULATORY_CARE_PROVIDER_SITE_OTHER): Payer: Self-pay | Admitting: Primary Care

## 2024-02-20 NOTE — Telephone Encounter (Signed)
 Called to confirm appt. Pt will be present

## 2024-02-26 ENCOUNTER — Telehealth (INDEPENDENT_AMBULATORY_CARE_PROVIDER_SITE_OTHER): Payer: Self-pay | Admitting: Primary Care

## 2024-02-26 NOTE — Telephone Encounter (Signed)
 Called pt to confirm appt. Pt will be present.

## 2024-02-27 ENCOUNTER — Ambulatory Visit (INDEPENDENT_AMBULATORY_CARE_PROVIDER_SITE_OTHER): Admitting: Primary Care

## 2024-05-15 ENCOUNTER — Ambulatory Visit (INDEPENDENT_AMBULATORY_CARE_PROVIDER_SITE_OTHER): Admitting: Primary Care

## 2024-05-21 ENCOUNTER — Ambulatory Visit (INDEPENDENT_AMBULATORY_CARE_PROVIDER_SITE_OTHER): Admitting: Primary Care

## 2024-05-22 ENCOUNTER — Other Ambulatory Visit: Payer: Self-pay | Admitting: Medical Genetics

## 2024-07-16 ENCOUNTER — Telehealth (INDEPENDENT_AMBULATORY_CARE_PROVIDER_SITE_OTHER): Payer: Self-pay | Admitting: Primary Care

## 2024-07-16 NOTE — Telephone Encounter (Signed)
 Called pt to confirm appt. Pt will be present.

## 2024-07-17 ENCOUNTER — Ambulatory Visit (INDEPENDENT_AMBULATORY_CARE_PROVIDER_SITE_OTHER): Admitting: Primary Care

## 2024-07-17 ENCOUNTER — Encounter (INDEPENDENT_AMBULATORY_CARE_PROVIDER_SITE_OTHER): Payer: Self-pay | Admitting: Primary Care

## 2024-07-17 VITALS — BP 127/84 | HR 95 | Resp 19 | Ht 66.0 in | Wt 206.8 lb

## 2024-07-17 DIAGNOSIS — M25512 Pain in left shoulder: Secondary | ICD-10-CM | POA: Diagnosis not present

## 2024-07-17 MED ORDER — IBUPROFEN 800 MG PO TABS: 800.0000 mg | ORAL_TABLET | Freq: Three times a day (TID) | ORAL | 0 refills | Status: AC | PRN

## 2024-07-17 NOTE — Progress Notes (Signed)
 [facility]  Breanna Carlson, is a 48 y.o. female  RDW:[id number]  FMW:[id number]  DOB - [birth year]  Chief Complaint  Patient presents with   Shoulder Pain    Past shoulder to upper arm injury.   Foot Swelling    Hand swelling as well       Subjective:   Breanna Carlson is a 48 y.o. female here today for an acute visit.  Patient complains of left shoulder pain.  Question what type of work that she did.  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  Due to the nature of incident it was mandatory that she wrote this information up.  Later on the week she noticed left arm difficulty with raising up, reaching out, brushing teeth trying to wash close or doing any type of housekeeping calls pain to increase.  Today on assessment popping and crepitus with stiffness will need to refer to orthopedics and advised patient to do an incident report on her.  She reports her hands and feet swelling has resolved.Patient has No headache, No chest pain, No abdominal pain - No Nausea, No new weakness tingling or numbness, No Cough - shortness of breath    No problems updated.  Comprehensive ROS Pertinent positive and negative noted in HPI   No Known Allergies  Past Medical History:  Diagnosis Date   Anemia     Current Outpatient Medications on File Prior to Visit  Medication Sig Dispense Refill   ferrous sulfate  325 (65 FE) MG tablet Take 1 tablet by mouth daily. 30 tablet 1   naproxen  sodium (ALEVE ) 220 MG tablet Take 220 mg by mouth daily as needed. (Patient not taking: Reported on 12/21/2023)     nicotine  (NICODERM CQ  - DOSED IN MG/24 HOURS) 14 mg/24hr patch Place 1 patch (14 mg total) onto the skin daily. 28 patch 0   No current facility-administered medications on file prior to visit.   Health Maintenance  Topic Date Due   Hepatitis C Screening  Never done   Pneumococcal Vaccine (1 of 2 - PCV) Never  done   Hepatitis B Vaccine (1 of 3 - 19+ 3-dose series) Never done   Colon Cancer Screening  Never done   COVID-19 Vaccine (1 - 2024-25 season) Never done   DTaP/Tdap/Td vaccine (1 - Tdap) 09/19/2024*   Flu Shot  02/10/2025*   Pap with HPV screening  10/31/2028   HIV Screening  Completed   HPV Vaccine  Aged Out   Meningitis B Vaccine  Aged Out  *Topic was postponed. The date shown is not the original due date.    Objective:   Vitals:   07/17/24 1352  BP: 127/84  Pulse: 95  Resp: 19  SpO2: 99%  Weight: 206 lb 12.8 oz (93.8 kg)  Height: 5' 6 (1.676 m)   BP Readings from Last 3 Encounters:  07/17/24 127/84  11/01/23 123/83  09/20/23 122/81      Physical Exam Vitals reviewed.  Constitutional:      Appearance: Normal appearance. She is obese.  HENT:     Head: Normocephalic.     Right Ear: Tympanic membrane, ear canal and external ear normal.     Left Ear: Tympanic membrane, ear canal and external ear normal.     Nose: Nose normal.     Mouth/Throat:     Mouth: Mucous membranes are moist.  Eyes:  Extraocular Movements: Extraocular movements intact.     Pupils: Pupils are equal, round, and reactive to light.  Cardiovascular:     Rate and Rhythm: Normal rate.  Pulmonary:     Effort: Pulmonary effort is normal.     Breath sounds: Normal breath sounds.  Abdominal:     General: Bowel sounds are normal.     Palpations: Abdomen is soft.  Musculoskeletal:        General: Normal range of motion.     Cervical back: Normal range of motion.     Comments: Decreased range of motion left arm trying to raise arm high elicits pain  Skin:    General: Skin is warm and dry.  Neurological:     Mental Status: She is alert and oriented to person, place, and time.  Psychiatric:        Mood and Affect: Mood normal.        Behavior: Behavior normal.        Thought Content: Thought content normal.     Assessment & Plan  Breanna Carlson was seen today for shoulder pain and  foot swelling.  Diagnoses and all orders for this visit:  Pain in joint of left shoulder -     AMB referral to orthopedics       Patient have been counseled extensively about nutrition and exercise. Other issues discussed during this visit include: low cholesterol diet, weight control and daily exercise, foot care, annual eye examinations at Ophthalmology, importance of adherence with medications and regular follow-up. We also discussed long term complications of uncontrolled diabetes and hypertension.   Return in about 3 months (around 10/16/2024) for fasting labs.  The patient was given clear instructions to go to ER or return to medical center if symptoms don't improve, worsen or new problems develop. The patient verbalized understanding. The patient was told to call to get lab results if they haven't heard anything in the next week.   This note has been created with Education officer, environmental. Any transcriptional errors are unintentional.   Breanna SHAUNNA Bohr, NP 07/17/2024, 2:34 PM

## 2024-07-17 NOTE — Progress Notes (Signed)
Intermittent pain

## 2024-07-22 ENCOUNTER — Telehealth: Admitting: Family Medicine

## 2024-07-22 NOTE — Progress Notes (Signed)
 The patient no-showed for appointment despite this provider sending direct link, reaching out via phone with no response and waiting for at least 10 minutes from appointment time for patient to join. They will be marked as a NS for this appointment/time.   Freddy Finner, NP

## 2024-07-31 ENCOUNTER — Ambulatory Visit (INDEPENDENT_AMBULATORY_CARE_PROVIDER_SITE_OTHER): Admitting: Orthopaedic Surgery

## 2024-07-31 ENCOUNTER — Other Ambulatory Visit: Payer: Self-pay

## 2024-07-31 ENCOUNTER — Other Ambulatory Visit (INDEPENDENT_AMBULATORY_CARE_PROVIDER_SITE_OTHER): Payer: Self-pay

## 2024-07-31 DIAGNOSIS — M25512 Pain in left shoulder: Secondary | ICD-10-CM

## 2024-07-31 DIAGNOSIS — G8929 Other chronic pain: Secondary | ICD-10-CM

## 2024-07-31 MED ORDER — MELOXICAM 15 MG PO TABS: 15.0000 mg | ORAL_TABLET | Freq: Every day | ORAL | 2 refills | Status: AC

## 2024-07-31 NOTE — Progress Notes (Signed)
 Office Visit Note   Patient: Breanna Carlson           Date of Birth: 07-17-1976           MRN: [id number] Visit Date: 07/31/2024              Requested by: Celestia Rosaline SQUIBB, NP [address] [place],  [place] [address] PCP: Celestia Rosaline SQUIBB, NP   Assessment & Plan: Visit Diagnoses:  1. Chronic left shoulder pain     Plan: History of Present Illness Breanna Karna Finder Ms Hink is a 48 year old female who presents with chronic left shoulder pain.  She experiences pain that radiates to her neck, arm, and sometimes chest, described as shooting, burning, and like needles. The pain can be severe, causing spasms that prevent her from riding in a car. Ibuprofen  has not provided relief. Sleeping on her left side worsens the pain, leading to poor sleep quality. She experiences swelling and stiffness in her hand upon waking, with sensations of needles and swelling. The pain feels heavy, and she limits the use of her left arm. Stress and poor sleep exacerbate the pain. She is concerned about accidentally sleeping on the affected side. There is no electrical shocking pain down her arm when her head is turned.  Physical Exam MUSCULOSKELETAL: Left shoulder pain with manipulation in all planes.  Manual muscle testing the rotator cuff is normal.  Negative Spurling's.  No bony tenderness.  Muscular tenderness to the rhomboids and trapezius.  Assessment and Plan Left shoulder rotator cuff tendinopathy and bursitis Chronic left shoulder pain with rotator cuff tendinopathy and bursitis.  I believe this is mild in nature.  No rotator cuff rupture. Pain worsens with certain activities and sleep. No surgical intervention needed. - Refer to physical therapy for shoulder rehabilitation and posture improvement. - Prescribe two-week supply of meloxicam . - Discuss potential benefits of acupuncture or deep tissue massage.  Left upper back and shoulder region muscle spasm Muscle  spasms in left upper back and shoulder due to guarding from shoulder pain. No serious pathology requiring surgery. - Refer to physical therapy for muscle tension and posture improvement. - Advise on stress management and posture correction.  Follow-Up Instructions: No follow-ups on file.   Orders:  Orders Placed This Encounter  Procedures   XR Shoulder Left   XR Cervical Spine 2 or 3 views   Ambulatory referral to Physical Therapy   Meds ordered this encounter  Medications   meloxicam  (MOBIC ) 15 MG tablet    Sig: Take 1 tablet (15 mg total) by mouth daily.    Dispense:  14 tablet    Refill:  2      Procedures: No procedures performed   Clinical Data: No additional findings.   Subjective: Chief Complaint  Patient presents with   Left Shoulder - Pain    HPI  Review of Systems  Constitutional: Negative.   HENT: Negative.    Eyes: Negative.   Respiratory: Negative.    Cardiovascular: Negative.   Endocrine: Negative.   Musculoskeletal: Negative.   Neurological: Negative.   Hematological: Negative.   Psychiatric/Behavioral: Negative.    All other systems reviewed and are negative.    Objective: Vital Signs: There were no vitals taken for this visit.  Physical Exam Vitals and nursing note reviewed.  Constitutional:      Appearance: She is well-developed.  HENT:     Head: Atraumatic.     Nose: Nose normal.  Eyes:     Extraocular Movements: Extraocular movements intact.  Cardiovascular:     Pulses: Normal pulses.  Pulmonary:     Effort: Pulmonary effort is normal.  Abdominal:     Palpations: Abdomen is soft.  Musculoskeletal:     Cervical back: Neck supple.  Skin:    General: Skin is warm.     Capillary Refill: Capillary refill takes less than 2 seconds.  Neurological:     Mental Status: She is alert. Mental status is at baseline.  Psychiatric:        Behavior: Behavior normal.        Thought Content: Thought content normal.        Judgment:  Judgment normal.     Ortho Exam  Specialty Comments:  No specialty comments available.  Imaging: XR Shoulder Left Result Date: 07/31/2024 X-rays of the left shoulder show no acute or structural abnormalities   XR Cervical Spine 2 or 3 views Result Date: 07/31/2024 X-rays of the cervical spine shows loss of cervical lordosis.  There is mild degenerative anterior vertebral spurring.    PMFS History: Patient Active Problem List   Diagnosis Date Noted   E coli bacteremia 09/03/2023   Acute pyelonephritis 09/03/2023   Tobacco dependence 09/03/2023   Past Medical History:  Diagnosis Date   Anemia     Family History  Problem Relation Age of Onset   Diabetes Mother    Hypertension Mother    Seizures Mother    Crohn's disease Neg Hx    Colon polyps Neg Hx    Esophageal cancer Neg Hx    Stomach cancer Neg Hx    Rectal cancer Neg Hx     No past surgical history on file. Social History   Occupational History   Not on file  Tobacco Use   Smoking status: Every Day    Types: Cigarettes   Smokeless tobacco: Never   Tobacco comments:    Just below 1 ppd  Vaping Use   Vaping status: Never Used  Substance and Sexual Activity   Alcohol use: Yes    Comment: occasional   Drug use: Never   Sexual activity: Yes    Birth control/protection: None

## 2024-09-10 NOTE — Therapy (Deleted)
 OUTPATIENT PHYSICAL THERAPY SHOULDER EVALUATION   Patient Name: Breanna Carlson MRN: [id number] DOB:August 14, 1976, 48 y.o., female Today's Date: 09/10/2024  END OF SESSION:   Past Medical History:  Diagnosis Date   Anemia    No past surgical history on file. Patient Active Problem List   Diagnosis Date Noted   E coli bacteremia 09/03/2023   Acute pyelonephritis 09/03/2023   Tobacco dependence 09/03/2023    PCP: Celestia Rosaline SQUIBB, NP   REFERRING PROVIDER: Jerri Kay HERO, MD  REFERRING DIAG: [phone] (ICD-10-CM) - Chronic left shoulder pain  THERAPY DIAG:  No diagnosis found.  Rationale for Evaluation and Treatment: Rehabilitation  ONSET DATE: chronic  SUBJECTIVE:                                                                                                                                                                                      SUBJECTIVE STATEMENT: Breanna Karna Carlson Breanna Carlson is a 48 year old female who presents with chronic left shoulder pain.   She experiences pain that radiates to her neck, arm, and sometimes chest, described as shooting, burning, and like needles. The pain can be severe, causing spasms that prevent her from riding in a car. Ibuprofen  has not provided relief. Sleeping on her left side worsens the pain, leading to poor sleep quality. She experiences swelling and stiffness in her hand upon waking, with sensations of needles and swelling. The pain feels heavy, and she limits the use of her left arm. Stress and poor sleep exacerbate the pain. She is concerned about accidentally sleeping on the affected side. There is no electrical shocking pain down her arm when her head is turned. Hand dominance: {MISC; OT HAND DOMINANCE:[phone]}  PERTINENT HISTORY: Left shoulder rotator cuff tendinopathy and bursitis Chronic left shoulder pain with rotator cuff tendinopathy and bursitis.  I believe this is mild in nature.  No rotator cuff  rupture. Pain worsens with certain activities and sleep. No surgical intervention needed. - Refer to physical therapy for shoulder rehabilitation and posture improvement. - Prescribe two-week supply of meloxicam . - Discuss potential benefits of acupuncture or deep tissue massage.   Left upper back and shoulder region muscle spasm Muscle spasms in left upper back and shoulder due to guarding from shoulder pain. No serious pathology requiring surgery. - Refer to physical therapy for muscle tension and posture improvement. - Advise on stress management and posture correction.  PAIN:  Are you having pain? {OPRCPAIN:27236}  PRECAUTIONS: None  RED FLAGS: None   WEIGHT BEARING RESTRICTIONS: No  FALLS:  Has patient fallen in last 6 months? No  OCCUPATION: ***  PLOF: Independent  PATIENT GOALS:To manage my  shoulder pain  NEXT MD VISIT:   OBJECTIVE:  Note: Objective measures were completed at Evaluation unless otherwise noted.  DIAGNOSTIC FINDINGS:  Imaging: XR Shoulder Left Result Date: 07/31/2024 X-rays of the left shoulder show no acute or structural abnormalities    XR Cervical Spine 2 or 3 views Result Date: 07/31/2024 X-rays of the cervical spine shows loss of cervical lordosis.  There is mild degenerative anterior vertebral spurring.  PATIENT SURVEYS:  Quick Dash:  QUICK DASH  Please rate your ability do the following activities in the last week by selecting the number below the appropriate response.   Activities Rating  Open a tight or new jar.  {Q-dash (1-6):32984}  Do heavy household chores (e.g., wash walls, floors). {Q-dash DEVELOPMENT WORKER, COMMUNITY  Carry a shopping bag or briefcase {Q-dash [facility] your back. {Q-dash (1-6):32984}  Use a knife to cut food. {Q-dash (1-6):32984}  Recreational activities in which you take some force or impact through your arm, shoulder or hand (e.g., golf, hammering, tennis, etc.). {Q-dash (1-6):32984}  During the past week, to  what extent has your arm, shoulder or hand problem interfered with your normal social activities with family, friends, neighbors or groups?  {Q-Dash 7:32985}  During the past week, were you limited in your work or other regular daily activities as a result of your arm, shoulder or hand problem? {Q-dash 8:32988}  Rate the severity of the following symptoms in the last week: Arm, Shoulder, or hand pain. {Q-dash 0-89:67013}  Rate the severity of the following symptoms in the last week: Tingling (pins and needles) in your arm, shoulder or hand. {Q-dash 0-89:67013}  During the past week, how much difficulty have you had sleeping because of the pain in your arm, shoulder or hand?  {Q-dash 11:32987}   (A QuickDASH score may not be calculated if there is greater than 1 missing item.)  Quick Dash Disability/Symptom Score: [(sum of *** (n) responses/*** (n)] x 25 = ***  Minimally Clinically Important Difference (MCID): 15-20 points  (Franchignoni, F. et al. (2013). Minimally clinically important difference of the disabilities of the arm, shoulder, and hand outcome measures (DASH) and its shortened version (Quick DASH). Journal of Orthopaedic & Sports Physical Therapy, 44(1), 30-39)   POSTURE: ***  UPPER EXTREMITY ROM:   {AROM/PROM:27142} ROM Right eval Left eval  Shoulder flexion    Shoulder extension    Shoulder abduction    Shoulder adduction    Shoulder internal rotation    Shoulder external rotation    Elbow flexion    Elbow extension    Wrist flexion    Wrist extension    Wrist ulnar deviation    Wrist radial deviation    Wrist pronation    Wrist supination    (Blank rows = not tested)  UPPER EXTREMITY MMT:  MMT Right eval Left eval  Shoulder flexion    Shoulder extension    Shoulder abduction    Shoulder adduction    Shoulder internal rotation    Shoulder external rotation    Middle trapezius    Lower trapezius    Elbow flexion    Elbow extension    Wrist flexion     Wrist extension    Wrist ulnar deviation    Wrist radial deviation    Wrist pronation    Wrist supination    Grip strength (lbs)    (Blank rows = not tested)  SHOULDER SPECIAL TESTS: Impingement tests: {shoulder impingement test:25231:a} SLAP lesions: {SLAP lesions:25232} Instability tests: {shoulder instability  test:25233} Rotator cuff assessment: {rotator cuff assessment:25234} Biceps assessment: {biceps assessment:25235}   PALPATION:  ***                                                                                                                             TREATMENT:  [facility] Adult PT Treatment:                                                DATE: *** Therapeutic Exercise: *** Manual Therapy: *** Neuromuscular re-ed: *** Therapeutic Activity: *** Modalities: *** Self Care: ***   PATIENT EDUCATION: Education details: Discussed eval findings, rehab rationale and POC and patient is in agreement  Person educated: Patient Education method: Explanation and Handouts Education comprehension: verbalized understanding and needs further education  HOME EXERCISE PROGRAM: ***  ASSESSMENT:  CLINICAL IMPRESSION: Patient is a *** y.o. *** who was seen today for physical therapy evaluation and treatment for ***.   OBJECTIVE IMPAIRMENTS: {opptimpairments:25111}.   ACTIVITY LIMITATIONS: {activitylimitations:27494}  PARTICIPATION LIMITATIONS: {participationrestrictions:25113}  PERSONAL FACTORS: {Personal factors:25162} are also affecting patient's functional outcome.   REHAB POTENTIAL: Good  CLINICAL DECISION MAKING: Stable/uncomplicated  EVALUATION COMPLEXITY: Moderate   GOALS: Goals reviewed with patient? No  SHORT TERM GOALS: Target date: ***  *** Baseline: Goal status: INITIAL  2.  *** Baseline:  Goal status: INITIAL  3.  *** Baseline:  Goal status: INITIAL  4.  *** Baseline:  Goal status: INITIAL  5.  *** Baseline:  Goal status:  INITIAL  6.  *** Baseline:  Goal status: INITIAL  LONG TERM GOALS: Target date: ***  *** Baseline:  Goal status: INITIAL  2.  *** Baseline:  Goal status: INITIAL  3.  *** Baseline:  Goal status: INITIAL  4.  *** Baseline:  Goal status: INITIAL  5.  *** Baseline:  Goal status: INITIAL  6.  *** Baseline:  Goal status: INITIAL  PLAN:  PT FREQUENCY: 1-2x/week  PT DURATION: 6 weeks  PLANNED INTERVENTIONS: 97110-Therapeutic exercises, 97530- Therapeutic activity, 97112- Neuromuscular re-education, 97535- Self Care, 02859- Manual therapy, and Patient/Family education  PLAN FOR NEXT SESSION: HEP review and update, manual techniques as appropriate, aerobic tasks, ROM and flexibility activities, strengthening and PREs, TPDN, gait and balance training,aquatic therapy, modalities for pain and NMRE      Reyes CHRISTELLA Kohut, PT 09/10/2024, 12:10 PM

## 2024-09-11 ENCOUNTER — Ambulatory Visit: Attending: Orthopaedic Surgery

## 2024-09-15 ENCOUNTER — Encounter: Payer: Self-pay | Admitting: Radiology

## 2024-09-17 ENCOUNTER — Ambulatory Visit (INDEPENDENT_AMBULATORY_CARE_PROVIDER_SITE_OTHER): Admitting: Primary Care

## 2024-09-23 ENCOUNTER — Ambulatory Visit (INDEPENDENT_AMBULATORY_CARE_PROVIDER_SITE_OTHER): Admitting: Primary Care

## 2024-10-15 ENCOUNTER — Other Ambulatory Visit (HOSPITAL_COMMUNITY)

## 2024-10-16 ENCOUNTER — Ambulatory Visit (INDEPENDENT_AMBULATORY_CARE_PROVIDER_SITE_OTHER): Admitting: Primary Care

## 2024-10-23 ENCOUNTER — Ambulatory Visit (INDEPENDENT_AMBULATORY_CARE_PROVIDER_SITE_OTHER): Admitting: Primary Care

## 2024-10-30 ENCOUNTER — Ambulatory Visit (INDEPENDENT_AMBULATORY_CARE_PROVIDER_SITE_OTHER): Admitting: Primary Care

## 2024-10-30 ENCOUNTER — Telehealth (INDEPENDENT_AMBULATORY_CARE_PROVIDER_SITE_OTHER): Admitting: Primary Care

## 2024-10-30 DIAGNOSIS — K2100 Gastro-esophageal reflux disease with esophagitis, without bleeding: Secondary | ICD-10-CM

## 2024-10-30 DIAGNOSIS — Z13220 Encounter for screening for lipoid disorders: Secondary | ICD-10-CM

## 2024-10-30 DIAGNOSIS — K5900 Constipation, unspecified: Secondary | ICD-10-CM | POA: Diagnosis not present

## 2024-10-30 DIAGNOSIS — R1013 Epigastric pain: Secondary | ICD-10-CM | POA: Diagnosis not present

## 2024-10-30 DIAGNOSIS — Z79899 Other long term (current) drug therapy: Secondary | ICD-10-CM

## 2024-10-30 DIAGNOSIS — D509 Iron deficiency anemia, unspecified: Secondary | ICD-10-CM | POA: Diagnosis not present

## 2024-10-30 MED ORDER — OMEPRAZOLE 20 MG PO CPDR: 20.0000 mg | DELAYED_RELEASE_CAPSULE | Freq: Every day | ORAL | 1 refills | Status: AC

## 2024-10-30 MED ORDER — SENNA-DOCUSATE SODIUM 8.6-50 MG PO TABS: 1.0000 | ORAL_TABLET | Freq: Every day | ORAL | 1 refills | Status: AC

## 2024-10-30 NOTE — Progress Notes (Signed)
 [facility]  Virtual Visit Note  I connected with Breanna Carlson, on 10/30/2024 at 11:30 AM through an audio and video application and verified that I am speaking with the correct person using two identifiers.   Consent: I discussed the limitations, risks, security and privacy concerns of performing an evaluation and management service by mychart and the availability of in person appointments. I also discussed with the patient that there may be a patient responsible charge related to this service. The patient expressed understanding and agreed to proceed.   Location of Patient: Home   Location of Provider: [facility]   Persons participating in visit: Breanna Carlson Rosaline Celestia,  NP   History of Present Illness: Breanna Carlson is a 47 year old female having a virtual visit for abdominal pain. After reviewing likely causes it is/was severe constipation not having BM for days as long as a week. There are certain food that help but strains and very hard. She drinks at least 32 oz of water a day d/w increase water intake, foods with high fiber and a laxative.   Past Medical History:  Diagnosis Date   Anemia    Allergies[1]  Medications Ordered Prior to Encounter[2]  Observations/Objective: See HPI  Assessment and Plan: Diagnoses and all orders for this visit:  Iron deficiency anemia, unspecified iron deficiency anemia type FESO4 supplement side effect is constipation not aware of this   Constipation, unspecified constipation type 2/2 Epigastric pain -     sennosides-docusate sodium  (SENOKOT-S) 8.6-50 MG tablet; Take 1 tablet by mouth daily.   Gastroesophageal reflux disease with esophagitis without hemorrhage 2/2 Epigastric pain -     omeprazole  (PRILOSEC) 20 MG capsule; Take 1 capsule (20 mg total) by mouth daily.  Medication management  Lipid screening Diagnoses and all  orders for this visit:  Iron deficiency anemia, unspecified iron deficiency anemia type -     CBC with Differential/Platelet; Future -     CMP14+EGFR; Future  Constipation, unspecified constipation type -     sennosides-docusate sodium  (SENOKOT-S) 8.6-50 MG tablet; Take 1 tablet by mouth daily.  Epigastric pain  Gastroesophageal reflux disease with esophagitis without hemorrhage -     omeprazole  (PRILOSEC) 20 MG capsule; Take 1 capsule (20 mg total) by mouth daily.  Medication management -     CBC with Differential/Platelet; Future -     CMP14+EGFR; Future   Lipid screening   -     Lipid panel; Future    Follow Up Instructions: Rtn    I discussed the assessment and treatment plan with the patient. The patient was provided an opportunity to ask questions and all were answered. The patient agreed with the plan and demonstrated an understanding of the instructions.   The patient was advised to call back or seek an in-person evaluation if the symptoms worsen or if the condition fails to improve as anticipated.     I provided 24 minutes total time during this encounter including median intraservice time, reviewing previous notes, investigations, ordering medications, medical decision making, coordinating care and patient verbalized understanding at the end of the visit.    This note has been created with Education officer, environmental. Any transcriptional errors are unintentional.   Rosaline SHAUNNA Celestia, NP 10/30/2024, 11:30 AM      [1] No Known Allergies [2]  Current Outpatient Medications on File Prior to Visit  Medication Sig Dispense Refill  ferrous sulfate  325 (65 FE) MG tablet Take 1 tablet by mouth daily. 30 tablet 1   ibuprofen  (ADVIL ) 800 MG tablet Take 1 tablet (800 mg total) by mouth every 8 (eight) hours as needed. 90 tablet 0   meloxicam  (MOBIC ) 15 MG tablet Take 1 tablet (15 mg total) by mouth daily. 14 tablet 2   No current  facility-administered medications on file prior to visit.

## 2024-11-04 ENCOUNTER — Ambulatory Visit: Attending: Orthopaedic Surgery

## 2024-12-17 ENCOUNTER — Emergency Department (HOSPITAL_COMMUNITY)
Admission: EM | Admit: 2024-12-17 | Discharge: 2024-12-17 | Discharge status: Against Medical Advice | Attending: Emergency Medicine | Admitting: Emergency Medicine

## 2024-12-17 ENCOUNTER — Encounter (HOSPITAL_COMMUNITY): Payer: Self-pay

## 2024-12-17 ENCOUNTER — Other Ambulatory Visit: Payer: Self-pay

## 2024-12-17 ENCOUNTER — Emergency Department (HOSPITAL_COMMUNITY)

## 2024-12-17 DIAGNOSIS — R42 Dizziness and giddiness: Secondary | ICD-10-CM | POA: Insufficient documentation

## 2024-12-17 DIAGNOSIS — M79602 Pain in left arm: Secondary | ICD-10-CM | POA: Insufficient documentation

## 2024-12-17 DIAGNOSIS — R5383 Other fatigue: Secondary | ICD-10-CM | POA: Insufficient documentation

## 2024-12-17 DIAGNOSIS — R630 Anorexia: Secondary | ICD-10-CM | POA: Insufficient documentation

## 2024-12-17 DIAGNOSIS — Z720 Tobacco use: Secondary | ICD-10-CM | POA: Insufficient documentation

## 2024-12-17 DIAGNOSIS — R079 Chest pain, unspecified: Secondary | ICD-10-CM | POA: Insufficient documentation

## 2024-12-17 DIAGNOSIS — M25512 Pain in left shoulder: Secondary | ICD-10-CM | POA: Insufficient documentation

## 2024-12-17 DIAGNOSIS — Z5321 Procedure and treatment not carried out due to patient leaving prior to being seen by health care provider: Secondary | ICD-10-CM | POA: Insufficient documentation

## 2024-12-17 DIAGNOSIS — R110 Nausea: Secondary | ICD-10-CM | POA: Insufficient documentation

## 2024-12-17 DIAGNOSIS — R0602 Shortness of breath: Secondary | ICD-10-CM | POA: Insufficient documentation

## 2024-12-17 LAB — BASIC METABOLIC PANEL WITH GFR
Anion gap: 10 (ref 5–15)
BUN: 10 mg/dL (ref 6–20)
CO2: 22 mmol/L (ref 22–32)
Calcium: 10.1 mg/dL (ref 8.9–10.3)
Chloride: 102 mmol/L (ref 98–111)
Creatinine, Ser: 0.59 mg/dL (ref 0.44–1.00)
GFR, Estimated: >60 mL/min
Glucose, Bld: 93 mg/dL (ref 70–99)
Potassium: 4.1 mmol/L (ref 3.5–5.1)
Sodium: 134 mmol/L — ABNORMAL LOW (ref 135–145)

## 2024-12-17 LAB — HCG, SERUM, QUALITATIVE: Preg, Serum: NEGATIVE

## 2024-12-17 LAB — CBC
HCT: 35.2 % — ABNORMAL LOW (ref 36.0–46.0)
Hemoglobin: 11.3 g/dL — ABNORMAL LOW (ref 12.0–15.0)
MCH: 26.3 pg (ref 26.0–34.0)
MCHC: 32.1 g/dL (ref 30.0–36.0)
MCV: 81.9 fL (ref 80.0–100.0)
Platelets: 377 10*3/uL (ref 150–400)
RBC: 4.3 MIL/uL (ref 3.87–5.11)
RDW: 17.1 % — ABNORMAL HIGH (ref 11.5–15.5)
WBC: 5.6 10*3/uL (ref 4.0–10.5)
nRBC: 0 % (ref 0.0–0.2)

## 2024-12-17 LAB — TROPONIN T, HIGH SENSITIVITY
Troponin T High Sensitivity: <6 ng/L (ref 0–19)
Troponin T High Sensitivity: 8 ng/L (ref 0–19)

## 2024-12-17 NOTE — ED Provider Triage Note (Signed)
 Emergency Medicine Provider Triage Evaluation Note  Madisen Ludvigsen , a 49 y.o. female  was evaluated in triage.  Pt complains of cp. Endorse L arm discomfort x 3 weeks and the past week it has radiates to chest with sob, and fatigue.  Some nausea and decreased appetite.  Does use tobacco, denies cardiac hx.  No trauma, no fever, chills, cough  Review of Systems  Positive: As above Negative: As above  Physical Exam  BP (!) 153/87 (BP Location: Right Arm)   Pulse 84   Temp 98.8 F (37.1 C)   Resp 16   Ht 5' 6 (1.676 m)   Wt 94.8 kg   SpO2 99%   BMI 33.73 kg/m  Gen:   Awake, no distress   Resp:  Normal effort  MSK:   Moves extremities without difficulty  Other:    Medical Decision Making  Medically screening exam initiated at 4:50 PM.  Appropriate orders placed.  [name] was informed that the remainder of the evaluation will be completed by another provider, this initial triage assessment does not replace that evaluation, and the importance of remaining in the ED until their evaluation is complete.     Nivia Colon, PA-C 12/17/24 1651

## 2024-12-17 NOTE — ED Triage Notes (Signed)
 Pt came in via POV d/t Lt arm/shoulder pain that started about 3 wks ago & now is sitting I her chest for the past 3 days. A/Ox4, does have some lightheaded/dizziness, rates her pain 8/10 during triage.

## 2024-12-30 ENCOUNTER — Ambulatory Visit: Admitting: Orthopaedic Surgery

## 2025-01-01 ENCOUNTER — Ambulatory Visit (INDEPENDENT_AMBULATORY_CARE_PROVIDER_SITE_OTHER): Admitting: Primary Care
